# Patient Record
Sex: Female | Born: 1965
Health system: Southern US, Community
[De-identification: ages and names within clinical notes are randomized; demographics above are authoritative.]

## PROBLEM LIST (undated history)

## (undated) DIAGNOSIS — I10 Essential (primary) hypertension: Secondary | ICD-10-CM

## (undated) DIAGNOSIS — L509 Urticaria, unspecified: Secondary | ICD-10-CM

## (undated) DIAGNOSIS — U071 COVID-19: Secondary | ICD-10-CM

## (undated) DIAGNOSIS — T783XXA Angioneurotic edema, initial encounter: Secondary | ICD-10-CM

## (undated) DIAGNOSIS — Z923 Personal history of irradiation: Secondary | ICD-10-CM

## (undated) DIAGNOSIS — C50919 Malignant neoplasm of unspecified site of unspecified female breast: Secondary | ICD-10-CM

## (undated) DIAGNOSIS — L309 Dermatitis, unspecified: Secondary | ICD-10-CM

## (undated) HISTORY — DX: Essential (primary) hypertension: I10

## (undated) HISTORY — DX: Dermatitis, unspecified: L30.9

## (undated) HISTORY — DX: COVID-19: U07.1

## (undated) HISTORY — DX: Urticaria, unspecified: L50.9

## (undated) HISTORY — DX: Angioneurotic edema, initial encounter: T78.3XXA

---

## 2014-03-22 DIAGNOSIS — C50919 Malignant neoplasm of unspecified site of unspecified female breast: Secondary | ICD-10-CM

## 2014-03-22 DIAGNOSIS — Z923 Personal history of irradiation: Secondary | ICD-10-CM

## 2014-03-22 HISTORY — DX: Malignant neoplasm of unspecified site of unspecified female breast: C50.919

## 2014-03-22 HISTORY — DX: Personal history of irradiation: Z92.3

## 2014-03-22 HISTORY — PX: BREAST LUMPECTOMY: SHX2

## 2015-01-01 ENCOUNTER — Other Ambulatory Visit: Payer: Self-pay | Admitting: Unknown Physician Specialty

## 2015-01-01 DIAGNOSIS — R921 Mammographic calcification found on diagnostic imaging of breast: Secondary | ICD-10-CM

## 2015-01-13 ENCOUNTER — Ambulatory Visit
Admission: RE | Admit: 2015-01-13 | Discharge: 2015-01-13 | Disposition: A | Discharge status: Home / Self Care | Payer: 59 | Source: Ambulatory Visit | Attending: Unknown Physician Specialty | Admitting: Unknown Physician Specialty

## 2015-01-13 DIAGNOSIS — R921 Mammographic calcification found on diagnostic imaging of breast: Secondary | ICD-10-CM

## 2015-11-19 ENCOUNTER — Ambulatory Visit (HOSPITAL_COMMUNITY): Payer: Self-pay | Admitting: Hematology & Oncology

## 2015-12-05 NOTE — Progress Notes (Signed)
[place]  CONSULT NOTE  No care team member to display  CHIEF COMPLAINTS/PURPOSE OF CONSULTATION:  High grade DCIS of the Left breast, 0.4 cm in greatest dimension Lumpectomy Adjuvant XRT Tamoxifen March 2017   HISTORY OF PRESENTING ILLNESS:  Kristen Kaiser 50 y.o. female is here to establish ongoing care for DCIS of the L breast. Routine screening mammogram on 01/15/2015 noted calcifications within the lateral aspect of the L breast. This area measured 8x5x5 mm in extent. Biopsy of this area showed DCIS with necrosis. ER + 100%, PR+ 100%  Kristen Kaiser is unaccompanied.   Breast conservation surgery with Dr. Anthony Sar on January 27, 2015. Margins were clear with the closest being the anterior margin at 0.1cm.  Radiation oncology with Dr. Sondra Come.   She was placed on Tamoxifen by Dr. Tressie Stalker.  She has only had one menstrual cycle since beginning Tamoxifen and it was heavy. She believes she may be going through menopause.  She was on OCP's up to her diagnosis. These have been discontinued.   She sees Dr. Derenda Mis in [place] for gynecology. She is scheduled to see him in November.   She receives annual screening mammograms. She does not perform self breast exams. She had a colonoscopy with Dr. Anthony Sar.  She reports hot flashes. 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   She has no other concerns or complaints.   The patient is here to establish care of high grade DCIS in the Left breast.    MEDICAL HISTORY:  History reviewed. No pertinent past medical history.  SURGICAL HISTORY: History reviewed. No pertinent surgical history.  SOCIAL HISTORY: Social History   Social History  . Marital status: Unknown    Spouse name: N/A  . Number of children: N/A  . Years of education: N/A   Occupational History  . Not on file.   Social History Main Topics  . Smoking status: Unknown If Ever Smoked  . Smokeless tobacco: Not on file  . Alcohol use Not on file    . Drug use: Unknown  . Sexual activity: Not on file   Other Topics Concern  . Not on file   Social History Narrative  . No narrative on file   Married 2 children, ages 50 and 75 0 grandchildren Ex smoker, quit 17 years ago. 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 She enjoys doing crossword puzzles and riding with her husband on his motorcycle  FAMILY HISTORY: History reviewed. No pertinent family history.  Mother is still living at 27 yo Father is still living at 70 yo Grandmother had breast cancer is her 8s. No other family history of breast cancer 1 brother and 1 sister. Healthy  ALLERGIES:  has no allergies on file.  MEDICATIONS:  Current Outpatient Prescriptions  Medication Sig Dispense Refill  . FLUoxetine (PROZAC) 40 MG capsule Take by mouth.    . Metoprolol-Hydrochlorothiazide 25-12.5 MG TB24 Take by mouth.    . tamoxifen (NOLVADEX) 20 MG tablet To start one a day May 21, 2015    . RESTASIS MULTIDOSE 0.05 % ophthalmic emulsion INSTIL 1 DROP TWICE A DAY INTO BOTH EYES  3   No current facility-administered medications for this visit.     Review of Systems  Constitutional: Negative.        Hot flashes  HENT: Negative.   Eyes: Negative.   Respiratory: Negative.   Cardiovascular: Negative.  Gastrointestinal: Negative.   Genitourinary: Negative.   Musculoskeletal: Negative.   Skin: Negative.   Neurological: Negative.   Endo/Heme/Allergies: Negative.   Psychiatric/Behavioral: Positive for depression.       Depression managed with Prozac  All other systems reviewed and are negative. 14 point ROS was done and is otherwise as detailed above or in HPI   PHYSICAL EXAMINATION: ECOG PERFORMANCE STATUS: 0 - Asymptomatic   Vitals:   12/08/15 1609  BP: (!) 148/101  Pulse: 82  Resp: 16  Temp: 98.4 F (36.9 C)   Filed Weights   12/08/15 1609  Weight: 181 lb (82.1 kg)    Physical Exam   Constitutional: She is oriented to person, place, and time and well-developed, well-nourished, and in no distress.  HENT:  Head: Normocephalic and atraumatic.  Nose: Nose normal.  Mouth/Throat: Oropharynx is clear and moist. No oropharyngeal exudate.  Eyes: Conjunctivae and EOM are normal. Pupils are equal, round, and reactive to light. Right eye exhibits no discharge. Left eye exhibits no discharge. No scleral icterus.  Neck: Normal range of motion. Neck supple. No tracheal deviation present. No thyromegaly present.  Cardiovascular: Normal rate, regular rhythm and normal heart sounds.  Exam reveals no gallop and no friction rub.   No murmur heard. Pulmonary/Chest: Effort normal and breath sounds normal. She has no wheezes. She has no rales.  Abdominal: Soft. Bowel sounds are normal. She exhibits no distension and no mass. There is no tenderness. There is no rebound and no guarding.  Musculoskeletal: Normal range of motion. She exhibits no edema.  Lymphadenopathy:    She has no cervical adenopathy.  Neurological: She is alert and oriented to person, place, and time. She has normal reflexes. No cranial nerve deficit. Gait normal. Coordination normal.  Skin: Skin is warm and dry. No rash noted.  Psychiatric: Mood, memory, affect and judgment normal.  Nursing note and vitals reviewed.   LABORATORY DATA:  I have reviewed the data as listed No results found for: WBC, HGB, HCT, MCV, PLT CMP  No results found for: NA, K, CL, CO2, GLUCOSE, BUN, CREATININE, CALCIUM, PROT, ALBUMIN, AST, ALT, ALKPHOS, BILITOT, GFRNONAA, GFRAA   RADIOGRAPHIC STUDIES: I have personally reviewed the radiological images as listed and agreed with the findings in the report. Addendum   ADDENDUM REPORT: 01/15/2015 11:22 ADDENDUM: Pathology revealed GRADE II TO III DUCTAL CARCINOMA IN SITU WITH NECROSIS AND CALCIFICATIONS in the left breast. This was found to be concordant by Dr. Evangeline Dakin. Pathology was  discussed with the patient by her physician, Dr. Santo Held. He made an appointment for her to see Dr. Anthony Sar in [place]. I called her to follow up and to assess her biopsy site. She reported that she had done well and had tenderness at the site. Post biopsy instructions and care were reviewed and her questions were answered. She was encouraged to call [facility] for any additional concerns and questions. Pathology results reported by Susa Raring RN, BSN on January 15, 2015. Electronically Signed   By: Evangeline Dakin M.D.   On: 01/15/2015 11:22  Addended by Evangeline Dakin, MD on 01/15/2015 11:29 AM    Study Result   CLINICAL DATA:  50 year old with screening detected 8 mm group of indeterminate calcifications in the upper outer quadrant of the left breast, posterior depth.  EXAM: LEFT BREAST STEREOTACTIC CORE NEEDLE BIOPSY  COMPARISON:  Previous exams.  FINDINGS: The patient and I discussed the procedure of stereotactic-guided biopsy including benefits and  alternatives. We discussed the high likelihood of a successful procedure. We discussed the risks of the procedure including infection, bleeding, tissue injury, clip migration, and inadequate sampling. Informed written consent was given. The usual time out protocol was performed immediately prior to the procedure.  Using sterile technique and 1% lidocaine as local anesthetic, under stereotactic guidance, a 9 gauge vacuum assisted device was used to perform core needle biopsy of calcifications in the upper outer quadrant of the left breast using a superior approach. Specimen radiograph was performed showing calcifications in essentially all of the core samples. Specimens with calcifications are identified for pathology.  At the conclusion of the procedure, an X shaped tissue marker clip was deployed into the biopsy cavity. Follow-up 2-view mammogram was performed and dictated  separately.  IMPRESSION: Stereotactic-guided biopsy of an 8 mm group of indeterminate calcifications in the upper outer quadrant of the left breast, posterior depth. No apparent complications.  Electronically Signed: By: Evangeline Dakin M.D. On: 01/13/2015 10:38      PATHOLOGY   ASSESSMENT & PLAN:   High grade DCIS of the Left breast Lumpectomy Adjuvant XRT Tamoxifen March 2017 Hx Depression  The patient is here to establish care of high grade DCIS in the Left breast. Taking Tamoxifen since March 2017.  I spoke with the patient about SSRI use with Tamoxifen. I reviewed the drug interactions between SSRIs and Tamoxifen. We specifically discussed the following:   Selective serotonin reuptake inhibitors (SSRI): Concomitant use with select SSRIs may result in decreased tamoxifen efficacy. Strong CYP2D6 inhibitors (eg, fluoxetine, paroxetine) and moderate CYP2D6 inhibitors (eg, sertraline) are reported to interfere with transformation to the active metabolite endoxifen; when possible, select alternative medications with minimal or no impact on endoxifen levels (NCCN Breast Cancer Risk Reduction Guidelines v.1.2013; Sideras, 2010). Weak CYP2D6 inhibitors (eg, venlafaxine, citalopram) have minimal effect on the conversion to endoxifen (Jin, 2005; NCCN Breast Cancer Risk Reduction Guidelines v.1.2013); escitalopram is also a weak CYP2D6 inhibitor. In a retrospective analysis of breast cancer patients taking tamoxifen and SSRIs, concomitant use of paroxetine and tamoxifen was associated with an increased risk of death due to breast cancer (Kelly, 2010).  We are going to taper her off prozac and onto celexa. She will let me know if she has any problems with this. She was given written instructions on how to do this.  NCCN guidelines recommends the following surveillance for DCIS of breast:  1. Interval history and physical exam every 6-12 months for 5 years, then annually.  2. Mammogram  every 12 months (and 6-12 months postradiation therapy if breast conserved [Category 2B]).  3. If treated with Tamoxifen, monitor per NCCN guidelines for Breast Cancer Risk Reduction. She will need yearly exams with her gynecologist.  RTC 3 months. Mammogram due this fall.    From Dr. Tressie Stalker:   All questions were answered. The patient knows to call the clinic with any problems, questions or concerns.  This document serves as a record of services personally performed by Ancil Linsey, MD. It was created on her behalf by Arlyce Harman, a trained medical scribe. The creation of this record is based on the scribe's personal observations and the provider's statements to them. This document has been checked and approved by the attending provider.  I have reviewed the above documentation for accuracy and completeness and I agree with the above.  This note was electronically signed.    Molli Hazard, MD  12/08/2015 4:11 PM

## 2015-12-08 ENCOUNTER — Encounter (HOSPITAL_COMMUNITY): Payer: Self-pay | Admitting: Hematology & Oncology

## 2015-12-08 ENCOUNTER — Encounter (HOSPITAL_COMMUNITY): Payer: Self-pay | Attending: Hematology & Oncology | Admitting: Hematology & Oncology

## 2015-12-08 VITALS — BP 148/101 | HR 82 | Temp 98.4°F | Resp 16 | Ht 62.25 in | Wt 181.0 lb

## 2015-12-08 DIAGNOSIS — D0512 Intraductal carcinoma in situ of left breast: Secondary | ICD-10-CM

## 2015-12-08 DIAGNOSIS — Z170 Estrogen receptor positive status [ER+]: Secondary | ICD-10-CM

## 2015-12-08 DIAGNOSIS — Z5181 Encounter for therapeutic drug level monitoring: Secondary | ICD-10-CM

## 2015-12-08 DIAGNOSIS — F32A Depression, unspecified: Secondary | ICD-10-CM

## 2015-12-08 DIAGNOSIS — F329 Major depressive disorder, single episode, unspecified: Secondary | ICD-10-CM

## 2015-12-08 DIAGNOSIS — Z79810 Long term (current) use of selective estrogen receptor modulators (SERMs): Secondary | ICD-10-CM

## 2015-12-08 MED ORDER — FLUOXETINE HCL 20 MG PO CAPS: 20.0000 mg | ORAL_CAPSULE | Freq: Every day | ORAL | 0 refills | Status: DC

## 2015-12-08 NOTE — Patient Instructions (Signed)
[place] at [facility] Discharge Instructions  RECOMMENDATIONS MADE BY THE CONSULTANT AND ANY TEST RESULTS WILL BE SENT TO YOUR REFERRING PHYSICIAN.  You were seen today by Dr.Penland. A Rx for Prozac 20mg  has been sent to your pharmacy.  As you get close to finishing the Rx call and we will start you on Celexa. Return in 3 months.   Thank you for choosing [place] at [facility] to provide your oncology and hematology care.  To afford each patient quality time with our provider, please arrive at least 15 minutes before your scheduled appointment time.   Beginning January 23rd 2017 lab work for the [facility] will be done in the  Main lab at [facility] on 1st floor. If you have a lab appointment with the [facility] please come in thru the  Main Entrance and check in at the main information desk  You need to re-schedule your appointment should you arrive 10 or more minutes late.  We strive to give you quality time with our providers, and arriving late affects you and other patients whose appointments are after yours.  Also, if you no show three or more times for appointments you may be dismissed from the clinic at the providers discretion.     Again, thank you for choosing [facility].  Our hope is that these requests will decrease the amount of time that you wait before being seen by our physicians.       _____________________________________________________________  Should you have questions after your visit to [facility], please contact our office at [phone] between the hours of 8:30 a.m. and 4:30 p.m.  Voicemails left after 4:30 p.m. will not be returned until the following business day.  For prescription refill requests, have your pharmacy contact our office.         Resources For Cancer Patients and their Caregivers ? American Cancer Society: Can assist with transportation, wigs, general  needs, runs Look Good Feel Better.        (409)431-5665 ? Cancer Care: Provides financial assistance, online support groups, medication/co-pay assistance.  1-800-813-HOPE 563 571 1937) ? [facility] Assists [place] cancer patients and their families through emotional , educational and financial support.  979-800-6106 ? [place] DSS Where to apply for food stamps, Medicaid and utility assistance. 902-178-6953 ? [employer]: Transportation to medical appointments. 5635270976 ? Social Security Administration: May apply for disability if have a Stage IV cancer. 929-548-1520 330-212-5503 ? [employer], Disability and Transit Services: Assists with nutrition, care and transit needs. [place] Support Programs: @10RELATIVEDAYS @ > Cancer Support Group  2nd Tuesday of the month 1pm-2pm, Journey Room  > Creative Journey  3rd Tuesday of the month 1130am-1pm, Journey Room  > Look Good Feel Better  1st Wednesday of the month 10am-12 noon, Journey Room (Call [facility] to register [phone])

## 2015-12-18 ENCOUNTER — Encounter (HOSPITAL_COMMUNITY): Payer: Self-pay | Admitting: Hematology & Oncology

## 2015-12-18 DIAGNOSIS — D0512 Intraductal carcinoma in situ of left breast: Secondary | ICD-10-CM | POA: Insufficient documentation

## 2016-01-08 ENCOUNTER — Other Ambulatory Visit (HOSPITAL_COMMUNITY): Payer: Self-pay | Admitting: Oncology

## 2016-01-08 DIAGNOSIS — D0512 Intraductal carcinoma in situ of left breast: Secondary | ICD-10-CM

## 2016-01-12 ENCOUNTER — Other Ambulatory Visit (HOSPITAL_COMMUNITY): Payer: Self-pay | Admitting: Oncology

## 2016-01-12 DIAGNOSIS — D0512 Intraductal carcinoma in situ of left breast: Secondary | ICD-10-CM

## 2016-01-19 ENCOUNTER — Other Ambulatory Visit (HOSPITAL_COMMUNITY): Payer: Self-pay | Admitting: Oncology

## 2016-01-19 ENCOUNTER — Telehealth (HOSPITAL_COMMUNITY): Payer: Self-pay | Admitting: *Deleted

## 2016-01-19 MED ORDER — CITALOPRAM HYDROBROMIDE 20 MG PO TABS: 20.0000 mg | ORAL_TABLET | Freq: Every day | ORAL | 0 refills | Status: DC

## 2016-01-23 ENCOUNTER — Other Ambulatory Visit (HOSPITAL_COMMUNITY): Payer: Self-pay

## 2016-01-27 ENCOUNTER — Encounter (HOSPITAL_COMMUNITY): Payer: Self-pay

## 2016-02-02 ENCOUNTER — Other Ambulatory Visit (HOSPITAL_COMMUNITY): Payer: Self-pay | Admitting: Oncology

## 2016-02-02 DIAGNOSIS — Z9889 Other specified postprocedural states: Secondary | ICD-10-CM

## 2016-02-03 ENCOUNTER — Ambulatory Visit (HOSPITAL_COMMUNITY)
Admission: RE | Admit: 2016-02-03 | Discharge: 2016-02-03 | Disposition: A | Discharge status: Home / Self Care | Payer: BLUE CROSS/BLUE SHIELD | Source: Ambulatory Visit | Attending: Oncology | Admitting: Oncology

## 2016-02-03 ENCOUNTER — Encounter (HOSPITAL_COMMUNITY): Payer: Self-pay | Admitting: Radiology

## 2016-02-03 DIAGNOSIS — Z853 Personal history of malignant neoplasm of breast: Secondary | ICD-10-CM | POA: Insufficient documentation

## 2016-02-03 DIAGNOSIS — D0512 Intraductal carcinoma in situ of left breast: Secondary | ICD-10-CM

## 2016-02-17 ENCOUNTER — Other Ambulatory Visit (HOSPITAL_COMMUNITY): Payer: Self-pay | Admitting: Oncology

## 2016-03-08 ENCOUNTER — Ambulatory Visit (HOSPITAL_COMMUNITY): Payer: Self-pay | Admitting: Hematology & Oncology

## 2016-03-08 NOTE — Progress Notes (Signed)
This encounter was created in error - please disregard.

## 2016-04-12 ENCOUNTER — Encounter (HOSPITAL_COMMUNITY): Payer: Self-pay | Admitting: Hematology & Oncology

## 2016-04-12 ENCOUNTER — Encounter (HOSPITAL_COMMUNITY): Payer: BLUE CROSS/BLUE SHIELD | Attending: Hematology & Oncology | Admitting: Hematology & Oncology

## 2016-04-12 VITALS — BP 137/89 | HR 68 | Temp 98.2°F | Resp 16 | Wt 182.0 lb

## 2016-04-12 DIAGNOSIS — Z5181 Encounter for therapeutic drug level monitoring: Secondary | ICD-10-CM

## 2016-04-12 DIAGNOSIS — F329 Major depressive disorder, single episode, unspecified: Secondary | ICD-10-CM

## 2016-04-12 DIAGNOSIS — Z79810 Long term (current) use of selective estrogen receptor modulators (SERMs): Secondary | ICD-10-CM

## 2016-04-12 DIAGNOSIS — D0512 Intraductal carcinoma in situ of left breast: Secondary | ICD-10-CM

## 2016-04-12 NOTE — Patient Instructions (Signed)
[facility] at [facility] Discharge Instructions  RECOMMENDATIONS MADE BY THE CONSULTANT AND ANY TEST RESULTS WILL BE SENT TO YOUR REFERRING PHYSICIAN.  You were seen today by Dr. Penland Follow up in 3 months with labwork  Thank you for choosing [facility] at [facility] to provide your oncology and hematology care.  To afford each patient quality time with our provider, please arrive at least 15 minutes before your scheduled appointment time.    If you have a lab appointment with the Cancer Center please come in thru the  Main Entrance and check in at the main information desk  You need to re-schedule your appointment should you arrive 10 or more minutes late.  We strive to give you quality time with our providers, and arriving late affects you and other patients whose appointments are after yours.  Also, if you no show three or more times for appointments you may be dismissed from the clinic at the providers discretion.     Again, thank you for choosing [facility].  Our hope is that these requests will decrease the amount of time that you wait before being seen by our physicians.       _____________________________________________________________  Should you have questions after your visit to [facility], please contact our office at [phone] between the hours of 8:30 a.m. and 4:30 p.m.  Voicemails left after 4:30 p.m. will not be returned until the following business day.  For prescription refill requests, have your pharmacy contact our office.       Resources For Cancer Patients and their Caregivers ? American Cancer Society: Can assist with transportation, wigs, general needs, runs Look Good Feel Better.        1-888-227-6333 ? Cancer Care: Provides financial assistance, online support groups, medication/co-pay assistance.  1-800-813-HOPE (4673) ? [facility] Assists [place] cancer patients and their families through emotional , educational and financial support.  336-427-4357 ? [place] DSS Where to apply for food stamps, Medicaid and utility assistance. 336-342-1394 ? RCATS: Transportation to medical appointments. 336-347-2287 ? Social Security Administration: May apply for disability if have a Stage IV cancer. 336-342-7796 1-800-772-1213 ? [place] Aging, Disability and Transit Services: Assists with nutrition, care and transit needs. 336-349-2343  Cancer Center Support Programs: @10RELATIVEDAYS@ > Cancer Support Group  2nd Tuesday of the month 1pm-2pm, Journey Room  > Creative Journey  3rd Tuesday of the month 1130am-1pm, Journey Room  > Look Good Feel Better  1st Wednesday of the month 10am-12 noon, Journey Room (Call American Cancer Society to register 1-800-395-5775)    

## 2016-04-12 NOTE — Progress Notes (Signed)
[place]  PROGRESS NOTE  Patient Care Team: Caryl Bis, MD as PCP - General (Family Medicine)  CHIEF COMPLAINTS/PURPOSE OF CONSULTATION:  High grade DCIS of the Left breast, 0.4 cm in greatest dimension Lumpectomy Adjuvant XRT Tamoxifen March 2017   HISTORY OF PRESENTING ILLNESS:  Kristen Kaiser 51 y.o. female is here to establish ongoing care for DCIS of the L breast. Routine screening mammogram on 01/15/2015 noted calcifications within the lateral aspect of the L breast. This area measured 8x5x5 mm in extent. Biopsy of this area showed DCIS with necrosis. ER + 100%, PR+ 100%  Breast conservation surgery with Dr. Anthony Sar on January 27, 2015. Margins were clear with the closest being the anterior margin at 0.1cm.  Radiation oncology with Dr. Sondra Come.   Kristen Kaiser was placed on Tamoxifen by Dr. Tressie Stalker. Kristen Kaiser has only had one menstrual cycle since beginning Tamoxifen and it was heavy. Kristen Kaiser believes Kristen Kaiser may be going through menopause.  Kristen Kaiser was on OCP's up to her diagnosis. These have been discontinued.   Kristen Kaiser is unaccompanied. Kristen Kaiser is doing well.   Kristen Kaiser is doing well on the Celexa.  Kristen Kaiser just had her mammogram in November.   Kristen Kaiser has some hot flashes. Kristen Kaiser occasionally wakes up at night with one, but it most commonly happens when Kristen Kaiser's at work and using a blow dryer. It gets better when Kristen Kaiser stands in front of a fan.   Kristen Kaiser has had a good appetite. Kristen Kaiser has had some swelling in her fingers and feet. Denies any other complaints.   MEDICAL HISTORY:  History reviewed. No pertinent past medical history. DCIS L breast Anxiety/Depression  SURGICAL HISTORY: History reviewed. No pertinent surgical history. L lumpectomy with Dr. Anthony Sar   SOCIAL HISTORY: Social History   Social History  . Marital status: Unknown    Spouse name: N/A  . Number of children: N/A  . Years of education: N/A   Occupational History  . Not on file.   Social History Main Topics  . Smoking  status: Former Smoker    Packs/day: 1.00    Years: 15.00    Types: Cigarettes    Quit date: 12/18/1998  . Smokeless tobacco: Never Used  . Alcohol use Not on file  . Drug use: Unknown  . Sexual activity: Not on file   Other Topics Concern  . Not on file   Social History Narrative  . No narrative on file   Married 2 children, ages 37 and 54 0 grandchildren Ex smoker, quit 17 years ago. 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 Kristen Kaiser enjoys doing crossword puzzles and riding with her husband on his motorcycle  FAMILY HISTORY: History reviewed. No pertinent family history.  Mother is still living at 68 yo Father is still living at 55 yo Grandmother had breast cancer is her 72s. No other family history of breast cancer 1 brother and 1 sister. Healthy  ALLERGIES:  has no allergies on file.  MEDICATIONS:  Current Outpatient Prescriptions  Medication Sig Dispense Refill  . citalopram (CELEXA) 20 MG tablet TAKE 1 TABLET (20 MG TOTAL) BY MOUTH DAILY. 30 tablet 2  . metoprolol-hydrochlorothiazide (LOPRESSOR HCT) 50-25 MG tablet   11  . RESTASIS MULTIDOSE 0.05 % ophthalmic emulsion INSTIL 1 DROP TWICE A DAY INTO BOTH EYES  3  . tamoxifen (NOLVADEX) 20 MG tablet To start one a day May 21, 2015  No current facility-administered medications for this visit.     Review of Systems  Constitutional:       Hot flashes. No periods since March 2017. Good appetite.   HENT: Negative.   Eyes: Negative.   Respiratory: Negative.   Cardiovascular: Negative.   Gastrointestinal: Negative.   Genitourinary: Negative.   Musculoskeletal:       Swelling in hands and feet  Skin: Negative.   Neurological: Negative.   Endo/Heme/Allergies: Negative.   Psychiatric/Behavioral: Negative.   All other systems reviewed and are negative. 14 point ROS was done and is otherwise as detailed above or in HPI  PHYSICAL EXAMINATION: ECOG  PERFORMANCE STATUS: 0 - Asymptomatic  Vitals:   04/12/16 1407  BP: 137/89  Pulse: 68  Resp: 16  Temp: 98.2 F (36.8 C)   Filed Weights   04/12/16 1407  Weight: 182 lb (82.6 kg)   Physical Exam  Constitutional: Kristen Kaiser is oriented to person, place, and time and well-developed, well-nourished, and in no distress.  Pt was able to get on exam table without assistance.   HENT:  Head: Normocephalic and atraumatic.  Nose: Nose normal.  Mouth/Throat: Oropharynx is clear and moist. No oropharyngeal exudate.  Eyes: Conjunctivae and EOM are normal. Pupils are equal, round, and reactive to light. Right eye exhibits no discharge. Left eye exhibits no discharge. No scleral icterus.  Neck: Normal range of motion. Neck supple. No tracheal deviation present. No thyromegaly present.  Cardiovascular: Normal rate, regular rhythm and normal heart sounds.  Exam reveals no gallop and no friction rub.   No murmur heard. Pulmonary/Chest: Effort normal and breath sounds normal. Kristen Kaiser has no wheezes. Kristen Kaiser has no rales.  Abdominal: Soft. Bowel sounds are normal. Kristen Kaiser exhibits no distension and no mass. There is no tenderness. There is no rebound and no guarding.  Musculoskeletal: Normal range of motion. Kristen Kaiser exhibits no edema.  Lymphadenopathy:    Kristen Kaiser has no cervical adenopathy.  Neurological: Kristen Kaiser is alert and oriented to person, place, and time. Kristen Kaiser has normal reflexes. No cranial nerve deficit. Gait normal. Coordination normal.  Skin: Skin is warm and dry. No rash noted.  Psychiatric: Mood, memory, affect and judgment normal.  Nursing note and vitals reviewed.   LABORATORY DATA:  I have reviewed the data as listed No results found for: WBC, HGB, HCT, MCV, PLT CMP  No results found for: NA, K, CL, CO2, GLUCOSE, BUN, CREATININE, CALCIUM, PROT, ALBUMIN, AST, ALT, ALKPHOS, BILITOT, GFRNONAA, GFRAA   RADIOGRAPHIC STUDIES: I have personally reviewed the radiological images as listed and agreed with the findings  in the report. Study Result   CLINICAL DATA:  History of malignant lumpectomy of the left breast in 2016.Annual re-evaluation.  EXAM: 2D DIGITAL DIAGNOSTIC BILATERAL MAMMOGRAM WITH CAD AND ADJUNCT TOMO  COMPARISON:  Previous exam(s).  ACR Breast Density Category c: The breast tissue is heterogeneously dense, which may obscure small masses.  FINDINGS: There are mild post lumpectomy scarring changes within the superior left breast. There is no specific evidence for recurrent tumor or developing malignancy within either breast.  Mammographic images were processed with CAD.  IMPRESSION: No findings worrisome for recurrent tumor or developing malignancy.  RECOMMENDATION: Bilateral diagnostic mammography in 1 year.  I have discussed the findings and recommendations with the patient. Results were also provided in writing at the conclusion of the visit. If applicable, a reminder letter will be sent to the patient regarding the next appointment.  BI-RADS CATEGORY  1: Negative.   Electronically Signed  By: Altamese Cabal M.D.   On: 02/03/2016 10:06       PATHOLOGY    ASSESSMENT & PLAN:   High grade DCIS of the Left breast Lumpectomy Adjuvant XRT Tamoxifen March 2017 Hx Depression  Kristen Kaiser is to continue on Tamoxifen. Kristen Kaiser is doing well. We switched her from prozac to Celexa and Kristen Kaiser has done well. Kristen Kaiser will continue with Celexa. Kristen Kaiser is currently up to date with mammography. Kristen Kaiser is not due again until November.   NCCN guidelines recommends the following surveillance for DCIS of breast:  1. Interval history and physical exam every 6-12 months for 5 years, then annually.  2. Mammogram every 12 months (and 6-12 months postradiation therapy if breast conserved [Category 2B]).  3. If treated with Tamoxifen, monitor per NCCN guidelines for Breast Cancer Risk Reduction. Kristen Kaiser will need yearly exams with her gynecologist.  Kristen Kaiser will return for a follow up in 3  months. Will check her hormones at this visit.   Orders Placed This Encounter  Procedures  . CBC with Differential    Standing Status:   Future    Standing Expiration Date:   08/10/2016  . Comprehensive metabolic panel    Standing Status:   Future    Standing Expiration Date:   08/10/2016  . Estradiol    Standing Status:   Future    Standing Expiration Date:   08/10/2016  . Follicle stimulating hormone    Standing Status:   Future    Standing Expiration Date:   08/10/2016    All questions were answered. The patient knows to call the clinic with any problems, questions or concerns.  This document serves as a record of services personally performed by Ancil Linsey, MD. It was created on her behalf by Martinique Casey, a trained medical scribe. The creation of this record is based on the scribe's personal observations and the provider's statements to them. This document has been checked and approved by the attending provider.  I have reviewed the above documentation for accuracy and completeness and I agree with the above.  This note was electronically signed.    Molli Hazard, MD  04/23/2016 8:29 AM

## 2016-04-23 ENCOUNTER — Encounter (HOSPITAL_COMMUNITY): Payer: Self-pay | Admitting: Hematology & Oncology

## 2016-06-25 ENCOUNTER — Other Ambulatory Visit (HOSPITAL_COMMUNITY): Payer: Self-pay

## 2016-06-25 DIAGNOSIS — D0512 Intraductal carcinoma in situ of left breast: Secondary | ICD-10-CM

## 2016-06-25 MED ORDER — CITALOPRAM HYDROBROMIDE 20 MG PO TABS: 20.0000 mg | ORAL_TABLET | Freq: Every day | ORAL | 1 refills | Status: DC

## 2016-06-25 NOTE — Telephone Encounter (Signed)
Received refill request from patients pharmacy for 90 day supply of citalopram. Reviewed with NP, chart checked and refilled.

## 2016-06-28 ENCOUNTER — Telehealth (HOSPITAL_COMMUNITY): Payer: Self-pay | Admitting: *Deleted

## 2016-06-28 ENCOUNTER — Other Ambulatory Visit (HOSPITAL_COMMUNITY): Payer: Self-pay | Admitting: Emergency Medicine

## 2016-06-28 MED ORDER — TAMOXIFEN CITRATE 20 MG PO TABS: 20.0000 mg | ORAL_TABLET | Freq: Every day | ORAL | 6 refills | Status: DC

## 2016-07-12 ENCOUNTER — Ambulatory Visit (HOSPITAL_COMMUNITY): Payer: Self-pay | Admitting: Hematology & Oncology

## 2016-07-12 ENCOUNTER — Ambulatory Visit (HOSPITAL_COMMUNITY): Payer: Self-pay

## 2016-07-12 ENCOUNTER — Other Ambulatory Visit (HOSPITAL_COMMUNITY): Payer: Self-pay

## 2016-07-26 ENCOUNTER — Encounter (HOSPITAL_COMMUNITY): Payer: Self-pay | Admitting: Oncology

## 2016-07-26 ENCOUNTER — Encounter (HOSPITAL_COMMUNITY): Payer: BLUE CROSS/BLUE SHIELD | Attending: Oncology

## 2016-07-26 ENCOUNTER — Encounter (HOSPITAL_BASED_OUTPATIENT_CLINIC_OR_DEPARTMENT_OTHER): Payer: BLUE CROSS/BLUE SHIELD | Admitting: Oncology

## 2016-07-26 VITALS — BP 142/93 | HR 72 | Temp 98.0°F | Resp 16 | Wt 184.2 lb

## 2016-07-26 DIAGNOSIS — G629 Polyneuropathy, unspecified: Secondary | ICD-10-CM | POA: Insufficient documentation

## 2016-07-26 DIAGNOSIS — G5603 Carpal tunnel syndrome, bilateral upper limbs: Secondary | ICD-10-CM | POA: Insufficient documentation

## 2016-07-26 DIAGNOSIS — D0512 Intraductal carcinoma in situ of left breast: Secondary | ICD-10-CM | POA: Diagnosis not present

## 2016-07-26 DIAGNOSIS — F329 Major depressive disorder, single episode, unspecified: Secondary | ICD-10-CM | POA: Insufficient documentation

## 2016-07-26 DIAGNOSIS — Z79810 Long term (current) use of selective estrogen receptor modulators (SERMs): Secondary | ICD-10-CM

## 2016-07-26 DIAGNOSIS — Z87891 Personal history of nicotine dependence: Secondary | ICD-10-CM | POA: Diagnosis not present

## 2016-07-26 DIAGNOSIS — G609 Hereditary and idiopathic neuropathy, unspecified: Secondary | ICD-10-CM | POA: Diagnosis not present

## 2016-07-26 DIAGNOSIS — F419 Anxiety disorder, unspecified: Secondary | ICD-10-CM | POA: Diagnosis not present

## 2016-07-26 LAB — COMPREHENSIVE METABOLIC PANEL
ALK PHOS: 86 U/L (ref 38–126)
ALT: 17 U/L (ref 14–54)
AST: 23 U/L (ref 15–41)
Albumin: 3.6 g/dL (ref 3.5–5.0)
Anion gap: 7 (ref 5–15)
BILIRUBIN TOTAL: 0.5 mg/dL (ref 0.3–1.2)
BUN: 11 mg/dL (ref 6–20)
CALCIUM: 9.1 mg/dL (ref 8.9–10.3)
CO2: 24 mmol/L (ref 22–32)
CREATININE: 0.69 mg/dL (ref 0.44–1.00)
Chloride: 106 mmol/L (ref 101–111)
GFR calc Af Amer: >60 mL/min (ref >60)
GFR calc non Af Amer: >60 mL/min (ref >60)
GLUCOSE: 101 mg/dL — AB (ref 65–99)
Potassium: 3.6 mmol/L (ref 3.5–5.1)
Sodium: 137 mmol/L (ref 135–145)
TOTAL PROTEIN: 6.7 g/dL (ref 6.5–8.1)

## 2016-07-26 LAB — CBC WITH DIFFERENTIAL/PLATELET
BASOS ABS: 0 10*3/uL (ref 0.0–0.1)
BASOS PCT: 0 %
EOS ABS: 0.1 10*3/uL (ref 0.0–0.7)
EOS PCT: 1 %
HEMATOCRIT: 41 % (ref 36.0–46.0)
HEMOGLOBIN: 14.1 g/dL (ref 12.0–15.0)
Lymphocytes Relative: 22 %
Lymphs Abs: 1.2 10*3/uL (ref 0.7–4.0)
MCH: 30.9 pg (ref 26.0–34.0)
MCHC: 34.4 g/dL (ref 30.0–36.0)
MCV: 89.9 fL (ref 78.0–100.0)
Monocytes Absolute: 0.3 10*3/uL (ref 0.1–1.0)
Monocytes Relative: 6 %
NEUTROS PCT: 71 %
Neutro Abs: 3.9 10*3/uL (ref 1.7–7.7)
Platelets: 200 10*3/uL (ref 150–400)
RBC: 4.56 MIL/uL (ref 3.87–5.11)
RDW: 13.1 % (ref 11.5–15.5)
WBC: 5.5 10*3/uL (ref 4.0–10.5)

## 2016-07-26 MED ORDER — GABAPENTIN 300 MG PO CAPS: 300.0000 mg | ORAL_CAPSULE | Freq: Two times a day (BID) | ORAL | 4 refills | Status: DC

## 2016-07-26 NOTE — Progress Notes (Signed)
[place]  PROGRESS NOTE  Patient Care Team: Caryl Bis, MD as PCP - General (Family Medicine)  CHIEF COMPLAINTS/PURPOSE OF CONSULTATION:  High grade DCIS of the Left breast, 0.4 cm in greatest dimension Lumpectomy Adjuvant XRT Tamoxifen March 2017   HISTORY OF PRESENTING ILLNESS:  Kristen Kaiser 51 y.o. female is here to establish ongoing care for DCIS of the L breast. Routine screening mammogram on 01/15/2015 noted calcifications within the lateral aspect of the L breast. This area measured 8x5x5 mm in extent. Biopsy of this area showed DCIS with necrosis. ER + 100%, PR+ 100%  Breast conservation surgery with Dr. Anthony Sar on January 27, 2015. Margins were clear with the closest being the anterior margin at 0.1cm.  Radiation oncology with Dr. Sondra Come.   She was placed on Tamoxifen by Dr. Tressie Stalker. She has only had one menstrual cycle since beginning Tamoxifen and it was heavy. She believes she may be going through menopause.  She was on OCP's up to her diagnosis. These have been discontinued.   Kristen Kaiser presents today for continuing follow up. She is doing well. I personally reviewed and went over labs with the patient.  She has some hot flashes. She occasionally wakes up at night with one, but it most commonly happens when she's at work and using a blow dryer. It gets better when she stands in front of a fan. She also reports neuropathy in her fingers bilaterally. She notes she occasionally gets "aches" in her toes as well. She has a history of carpal tunnel syndrome in both her hands, but she feels like this tingling is different. It is on all of her fingertips.  She denies feeling any masses on her breasts on self breast exams.    MEDICAL HISTORY:  No past medical history on file. DCIS L breast Anxiety/Depression  SURGICAL HISTORY: No past surgical history on file. L lumpectomy with Dr. Anthony Sar   SOCIAL HISTORY: Social History   Social History  .  Marital status: Unknown    Spouse name: N/A  . Number of children: N/A  . Years of education: N/A   Occupational History  . Not on file.   Social History Main Topics  . Smoking status: Former Smoker    Packs/day: 1.00    Years: 15.00    Types: Cigarettes    Quit date: 12/18/1998  . Smokeless tobacco: Never Used  . Alcohol use Not on file  . Drug use: Unknown  . Sexual activity: Not on file   Other Topics Concern  . Not on file   Social History Narrative  . No narrative on file   Married 2 children, ages 37 and 23 0 grandchildren Ex smoker, quit 17 years ago. 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 She enjoys doing crossword puzzles and riding with her husband on his motorcycle  FAMILY HISTORY: No family history on file.  Mother is still living at 38 yo Father is still living at 83 yo Grandmother had breast cancer is her 31s. No other family history of breast cancer 1 brother and 1 sister. Healthy  ALLERGIES:  has no allergies on file.  MEDICATIONS:  Current Outpatient Prescriptions  Medication Sig Dispense Refill  . citalopram (CELEXA) 20 MG tablet Take 1 tablet (20 mg total) by mouth daily. 90 tablet 1  . metoprolol-hydrochlorothiazide (LOPRESSOR HCT) 50-25 MG tablet   11  . RESTASIS MULTIDOSE 0.05 %  ophthalmic emulsion INSTIL 1 DROP TWICE A DAY INTO BOTH EYES  3  . tamoxifen (NOLVADEX) 20 MG tablet Take 1 tablet (20 mg total) by mouth daily. 30 tablet 6   No current facility-administered medications for this visit.     Review of Systems  Constitutional:       Hot flashes. No periods since March 2017 when she started her tamoxifen. Good appetite.   HENT: Negative.   Eyes: Negative.   Respiratory: Negative.   Cardiovascular: Negative.   Gastrointestinal: Negative.   Genitourinary: Negative.   Skin: Negative.   Neurological: Positive for tingling (neuropathy in fingertips).    Endo/Heme/Allergies: Negative.   Psychiatric/Behavioral: Negative.   All other systems reviewed and are negative. 14 point ROS was done and is otherwise as detailed above or in HPI  PHYSICAL EXAMINATION: ECOG PERFORMANCE STATUS: 0 - Asymptomatic  Vitals:   07/26/16 1125  BP: (!) 142/93  Pulse: 72  Resp: 16  Temp: 98 F (36.7 C)     Physical Exam  Constitutional: She is oriented to person, place, and time and well-developed, well-nourished, and in no distress.  Pt was able to get on exam table without assistance.   HENT:  Head: Normocephalic and atraumatic.  Nose: Nose normal.  Mouth/Throat: Oropharynx is clear and moist. No oropharyngeal exudate.  Eyes: Conjunctivae and EOM are normal. Pupils are equal, round, and reactive to light. Right eye exhibits no discharge. Left eye exhibits no discharge. No scleral icterus.  Neck: Normal range of motion. Neck supple. No tracheal deviation present. No thyromegaly present.  Cardiovascular: Normal rate, regular rhythm and normal heart sounds.  Exam reveals no gallop and no friction rub.   No murmur heard. Pulmonary/Chest: Effort normal and breath sounds normal. She has no wheezes. She has no rales. Right breast exhibits no inverted nipple, no mass, no nipple discharge, no skin change and no tenderness. Left breast exhibits no inverted nipple, no mass, no nipple discharge, no skin change and no tenderness. Breasts are symmetrical.  Abdominal: Soft. Bowel sounds are normal. She exhibits no distension and no mass. There is no tenderness. There is no rebound and no guarding.  Musculoskeletal: Normal range of motion. She exhibits no edema.  Lymphadenopathy:    She has no cervical adenopathy.  Neurological: She is alert and oriented to person, place, and time. She has normal reflexes. No cranial nerve deficit. Gait normal. Coordination normal.  Skin: Skin is warm and dry. No rash noted.  Psychiatric: Mood, memory, affect and judgment normal.   Nursing note and vitals reviewed.   LABORATORY DATA:  I have reviewed the data as listed Lab Results  Component Value Date   WBC 5.5 07/26/2016   HGB 14.1 07/26/2016   HCT 41.0 07/26/2016   MCV 89.9 07/26/2016   PLT 200 07/26/2016   CMP     Component Value Date/Time   NA 137 07/26/2016 1031   K 3.6 07/26/2016 1031   CL 106 07/26/2016 1031   CO2 24 07/26/2016 1031   GLUCOSE 101 (H) 07/26/2016 1031   BUN 11 07/26/2016 1031   CREATININE 0.69 07/26/2016 1031   CALCIUM 9.1 07/26/2016 1031   PROT 6.7 07/26/2016 1031   ALBUMIN 3.6 07/26/2016 1031   AST 23 07/26/2016 1031   ALT 17 07/26/2016 1031   ALKPHOS 86 07/26/2016 1031   BILITOT 0.5 07/26/2016 1031   GFRNONAA >60 07/26/2016 1031   GFRAA >60 07/26/2016 1031     RADIOGRAPHIC STUDIES: I have personally reviewed the  radiological images as listed and agreed with the findings in the report. Study Result   CLINICAL DATA:  History of malignant lumpectomy of the left breast in 2016.Annual re-evaluation.  EXAM: 2D DIGITAL DIAGNOSTIC BILATERAL MAMMOGRAM WITH CAD AND ADJUNCT TOMO  COMPARISON:  Previous exam(s).  ACR Breast Density Category c: The breast tissue is heterogeneously dense, which may obscure small masses.  FINDINGS: There are mild post lumpectomy scarring changes within the superior left breast. There is no specific evidence for recurrent tumor or developing malignancy within either breast.  Mammographic images were processed with CAD.  IMPRESSION: No findings worrisome for recurrent tumor or developing malignancy.  RECOMMENDATION: Bilateral diagnostic mammography in 1 year.  I have discussed the findings and recommendations with the patient. Results were also provided in writing at the conclusion of the visit. If applicable, a reminder letter will be sent to the patient regarding the next appointment.  BI-RADS CATEGORY  1: Negative.   Electronically Signed   By: Altamese Cabal  M.D.   On: 02/03/2016 10:06       PATHOLOGY    ASSESSMENT & PLAN:   High grade DCIS of the Left breast Lumpectomy Adjuvant XRT Tamoxifen March 2017 Hx Depression Neuropathy in hands  Labs reviewed with the patient. Results noted above. CBC, CMP WNL. FSH and estradiol are still pending.   Clinically NED on breast exam today.  Continue with tamoxifen.  She needs yearly eye and gynecological exams. Ordered bilateral diagnostic mammogram to be performed in 01/2017. Prescribed her 300 mg BID Gabapentin for her neuropathy in her finger tips. May uptitrate to 300mg  PO TID if minimal improvement.  RTC in 6 months for follow up with labs.  NCCN guidelines recommends the following surveillance for DCIS of breast:  1. Interval history and physical exam every 6-12 months for 5 years, then annually.  2. Mammogram every 12 months (and 6-12 months postradiation therapy if breast conserved [Category 2B]).  3. If treated with Tamoxifen, monitor per NCCN guidelines for Breast Cancer Risk Reduction. Orders Placed This Encounter  Procedures  . MM Digital Diagnostic Bilat    Standing Status:   Future    Standing Expiration Date:   07/26/2017    Order Specific Question:   Reason for Exam (SYMPTOM  OR DIAGNOSIS REQUIRED)    Answer:   DCIS    Order Specific Question:   Is the patient pregnant?    Answer:   No    Order Specific Question:   Preferred imaging location?    Answer:   [facility]  . CBC with Differential    Standing Status:   Future    Standing Expiration Date:   07/26/2017  . Comprehensive metabolic panel    Standing Status:   Future    Standing Expiration Date:   07/26/2017    All questions were answered. The patient knows to call the clinic with any problems, questions or concerns.  This document serves as a record of services personally performed by Twana First, MD. It was created on her behalf by Shirlean Mylar, a trained medical scribe. The creation of this record  is based on the scribe's personal observations and the provider's statements to them. This document has been checked and approved by the attending provider.  I have reviewed the above documentation for accuracy and completeness and I agree with the above.  This note was electronically signed.    Mikey [employer]  07/26/2016 11:28 AM

## 2016-07-26 NOTE — Patient Instructions (Signed)
Kristen Kaiser at [facility] Discharge Instructions  RECOMMENDATIONS MADE BY THE CONSULTANT AND ANY TEST RESULTS WILL BE SENT TO YOUR REFERRING PHYSICIAN.  You were seen today by Dr. Twana First  Follow up in 6 months with lab work Mammogram ordered for November  Thank you for choosing [place] at [facility] to provide your oncology and hematology care.  To afford each patient quality time with our provider, please arrive at least 15 minutes before your scheduled appointment time.    If you have a lab appointment with the [facility] please come in thru the  Main Entrance and check in at the main information desk  You need to re-schedule your appointment should you arrive 10 or more minutes late.  We strive to give you quality time with our providers, and arriving late affects you and other patients whose appointments are after yours.  Also, if you no show three or more times for appointments you may be dismissed from the clinic at the providers discretion.     Again, thank you for choosing [facility].  Our hope is that these requests will decrease the amount of time that you wait before being seen by our physicians.       _____________________________________________________________  Should you have questions after your visit to [facility], please contact our office at [phone] between the hours of 8:30 a.m. and 4:30 p.m.  Voicemails left after 4:30 p.m. will not be returned until the following business day.  For prescription refill requests, have your pharmacy contact our office.       Resources For Cancer Patients and their Caregivers ? American Cancer Society: Can assist with transportation, wigs, general needs, runs Look Good Feel Better.        236-271-1682 ? Cancer Care: Provides financial assistance, online support groups, medication/co-pay assistance.  1-800-813-HOPE (256) 287-6715) ? [place] Assists [place] cancer patients and their families through emotional , educational and financial support.  301-141-5769 ? [place] DSS Where to apply for food stamps, Medicaid and utility assistance. 450-095-7483 ? [facility]: Transportation to medical appointments. 403-050-5251 ? Social Security Administration: May apply for disability if have a Stage IV cancer. 701 622 9077 972-349-6770 ? [employer], Disability and Transit Services: Assists with nutrition, care and transit needs. [place] Support Programs: @10RELATIVEDAYS @ > Cancer Support Group  2nd Tuesday of the month 1pm-2pm, Journey Room  > Creative Journey  3rd Tuesday of the month 1130am-1pm, Journey Room  > Look Good Feel Better  1st Wednesday of the month 10am-12 noon, Journey Room (Call [place] to register [phone])

## 2016-07-27 LAB — ESTRADIOL: Estradiol: 271.7 pg/mL

## 2016-07-27 LAB — FOLLICLE STIMULATING HORMONE: FSH: 32.2 m[IU]/mL

## 2017-01-31 ENCOUNTER — Ambulatory Visit (HOSPITAL_COMMUNITY): Payer: Self-pay

## 2017-01-31 ENCOUNTER — Other Ambulatory Visit (HOSPITAL_COMMUNITY): Payer: Self-pay

## 2017-02-01 ENCOUNTER — Ambulatory Visit (HOSPITAL_COMMUNITY)
Admission: RE | Admit: 2017-02-01 | Discharge: 2017-02-01 | Disposition: A | Discharge status: Home / Self Care | Payer: BLUE CROSS/BLUE SHIELD | Source: Ambulatory Visit | Attending: Oncology | Admitting: Oncology

## 2017-02-01 DIAGNOSIS — D0512 Intraductal carcinoma in situ of left breast: Secondary | ICD-10-CM | POA: Diagnosis present

## 2017-02-04 ENCOUNTER — Other Ambulatory Visit (HOSPITAL_COMMUNITY): Payer: Self-pay | Admitting: Oncology

## 2017-02-04 DIAGNOSIS — D0512 Intraductal carcinoma in situ of left breast: Secondary | ICD-10-CM

## 2017-02-07 ENCOUNTER — Other Ambulatory Visit: Payer: Self-pay

## 2017-02-07 ENCOUNTER — Encounter (HOSPITAL_COMMUNITY): Payer: Self-pay | Admitting: Oncology

## 2017-02-07 ENCOUNTER — Encounter (HOSPITAL_COMMUNITY): Payer: BLUE CROSS/BLUE SHIELD | Attending: Oncology

## 2017-02-07 ENCOUNTER — Other Ambulatory Visit (HOSPITAL_COMMUNITY): Payer: Self-pay | Admitting: Oncology

## 2017-02-07 ENCOUNTER — Encounter (HOSPITAL_BASED_OUTPATIENT_CLINIC_OR_DEPARTMENT_OTHER): Payer: BLUE CROSS/BLUE SHIELD | Admitting: Oncology

## 2017-02-07 VITALS — BP 161/94 | HR 81 | Temp 98.4°F | Resp 16 | Ht 63.0 in | Wt 186.0 lb

## 2017-02-07 DIAGNOSIS — Z170 Estrogen receptor positive status [ER+]: Secondary | ICD-10-CM | POA: Diagnosis not present

## 2017-02-07 DIAGNOSIS — D0512 Intraductal carcinoma in situ of left breast: Secondary | ICD-10-CM | POA: Insufficient documentation

## 2017-02-07 LAB — CBC WITH DIFFERENTIAL/PLATELET
BASOS ABS: 0 10*3/uL (ref 0.0–0.1)
Basophils Relative: 1 %
EOS ABS: 0.1 10*3/uL (ref 0.0–0.7)
Eosinophils Relative: 2 %
HCT: 43.8 % (ref 36.0–46.0)
HEMOGLOBIN: 14.2 g/dL (ref 12.0–15.0)
LYMPHS ABS: 1.2 10*3/uL (ref 0.7–4.0)
Lymphocytes Relative: 21 %
MCH: 29.8 pg (ref 26.0–34.0)
MCHC: 32.4 g/dL (ref 30.0–36.0)
MCV: 92 fL (ref 78.0–100.0)
Monocytes Absolute: 0.5 10*3/uL (ref 0.1–1.0)
Monocytes Relative: 8 %
NEUTROS PCT: 68 %
Neutro Abs: 4 10*3/uL (ref 1.7–7.7)
Platelets: 226 10*3/uL (ref 150–400)
RBC: 4.76 MIL/uL (ref 3.87–5.11)
RDW: 13 % (ref 11.5–15.5)
WBC: 5.9 10*3/uL (ref 4.0–10.5)

## 2017-02-07 LAB — COMPREHENSIVE METABOLIC PANEL
ALK PHOS: 96 U/L (ref 38–126)
ALT: 14 U/L (ref 14–54)
AST: 23 U/L (ref 15–41)
Albumin: 3.6 g/dL (ref 3.5–5.0)
Anion gap: 8 (ref 5–15)
BUN: 9 mg/dL (ref 6–20)
CALCIUM: 9.2 mg/dL (ref 8.9–10.3)
CHLORIDE: 105 mmol/L (ref 101–111)
CO2: 26 mmol/L (ref 22–32)
CREATININE: 0.74 mg/dL (ref 0.44–1.00)
GFR calc non Af Amer: >60 mL/min (ref >60)
GLUCOSE: 104 mg/dL — AB (ref 65–99)
Potassium: 4 mmol/L (ref 3.5–5.1)
SODIUM: 139 mmol/L (ref 135–145)
Total Bilirubin: 0.5 mg/dL (ref 0.3–1.2)
Total Protein: 6.8 g/dL (ref 6.5–8.1)

## 2017-02-07 NOTE — Progress Notes (Signed)
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 female is here to establish ongoing care for DCIS of the L breast. Routine screening mammogram on 01/15/2015 noted calcifications within the lateral aspect of the L breast. This area measured 8x5x5 mm in extent. Biopsy of this area showed DCIS with necrosis. ER + 100%, PR+ 100%  Breast conservation surgery with Dr. Anthony Sar on January 27, 2015. Margins were clear with the closest being the anterior margin at 0.1cm.  Radiation oncology with Dr. Sondra Come.   Patient presents today for continue follow-up.  She states that overall she has been doing well.  She had her annual mammogram on 02/01/2017 which was negative for malignancy.  She continues to take the tamoxifen without any problems.  She continues to have neuropathy in her fingertips, but she has not been taking the gabapentin as prescribed, even though she did note that it was helping her previously.  She states she is worried about getting addicted to gabapentin.   MEDICAL HISTORY:  History reviewed. No pertinent past medical history. DCIS L breast Anxiety/Depression  SURGICAL HISTORY: History reviewed. No pertinent surgical history. L lumpectomy with Dr. Anthony Sar   SOCIAL HISTORY: Social History   Socioeconomic History  . Marital status: Unknown    Spouse name: Not on file  . Number of children: Not on file  . Years of education: Not on file  . Highest education level: Not on file  Social Needs  . Financial resource strain: Not on file  . Food insecurity - worry: Not on file  . Food insecurity - inability: Not on file  . Transportation needs - medical: Not on file  . Transportation needs -  non-medical: Not on file  Occupational History  . Not on file  Tobacco Use  . Smoking status: Former Smoker    Packs/day: 1.00    Years: 15.00    Pack years: 15.00    Types: Cigarettes    Last attempt to quit: 12/18/1998    Years since quitting: 18.1  . Smokeless tobacco: Never Used  Substance and Sexual Activity  . Alcohol use: Not on file  . Drug use: Not on file  . Sexual activity: Not on file  Other Topics Concern  . Not on file  Social History Narrative  . Not on file   Married 2 children, ages 82 and 4 0 grandchildren Ex smoker, quit 17 years ago. 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 She enjoys doing crossword puzzles and riding with her husband on his motorcycle  FAMILY HISTORY: History reviewed. No pertinent family history.  Mother is still living at 66 yo Father is still living at 28 yo Grandmother had breast cancer is her 76s. No other family history of breast cancer 1 brother and 1 sister. Healthy  ALLERGIES:  has no allergies on file.  MEDICATIONS:  Current Outpatient Medications  Medication Sig Dispense Refill  . citalopram (CELEXA) 20 MG tablet TAKE 1 TABLET (20 MG TOTAL) BY MOUTH DAILY. 90 tablet 1  . gabapentin (NEURONTIN) 300 MG capsule Take 1 capsule (300 mg total) by mouth 2 (two) times daily. If no significant improvement after 1-2  weeks, can increase dose to 300mg  PO TID 90 capsule 4  . tamoxifen (NOLVADEX) 20 MG tablet Take 1 tablet (20 mg total) by mouth daily. 30 tablet 6   No current facility-administered medications for this visit.     Review of Systems  Constitutional:       Hot flashes. No periods since March 2017 when she started her tamoxifen. Good appetite.   HENT: Negative.   Eyes: Negative.   Respiratory: Negative.   Cardiovascular: Negative.   Gastrointestinal: Negative.   Genitourinary: Negative.   Skin: Negative.   Neurological: Positive for tingling  (neuropathy in fingertips).  Endo/Heme/Allergies: Negative.   Psychiatric/Behavioral: Negative.   All other systems reviewed and are negative. 14 point ROS was done and is otherwise as detailed above or in HPI  PHYSICAL EXAMINATION: ECOG PERFORMANCE STATUS: 0 - Asymptomatic  Vitals:   02/07/17 1030  BP: (!) 161/94  Pulse: 81  Resp: 16  Temp: 98.4 F (36.9 C)  SpO2: 97%     Physical Exam  Constitutional: She is oriented to person, place, and time and well-developed, well-nourished, and in no distress.  Pt was able to get on exam table without assistance.   HENT:  Head: Normocephalic and atraumatic.  Nose: Nose normal.  Mouth/Throat: Oropharynx is clear and moist. No oropharyngeal exudate.  Eyes: Conjunctivae and EOM are normal. Pupils are equal, round, and reactive to light. Right eye exhibits no discharge. Left eye exhibits no discharge. No scleral icterus.  Neck: Normal range of motion. Neck supple. No tracheal deviation present. No thyromegaly present.  Cardiovascular: Normal rate, regular rhythm and normal heart sounds. Exam reveals no gallop and no friction rub.  No murmur heard. Pulmonary/Chest: Effort normal and breath sounds normal. She has no wheezes. She has no rales.  Abdominal: Soft. Bowel sounds are normal. She exhibits no distension and no mass. There is no tenderness. There is no rebound and no guarding.  Musculoskeletal: Normal range of motion. She exhibits no edema.  Lymphadenopathy:    She has no cervical adenopathy.  Neurological: She is alert and oriented to person, place, and time. She has normal reflexes. No cranial nerve deficit. Gait normal. Coordination normal.  Skin: Skin is warm and dry. No rash noted.  Psychiatric: Mood, memory, affect and judgment normal.  Nursing note and vitals reviewed.   LABORATORY DATA:  I have reviewed the data as listed Lab Results  Component Value Date   WBC 5.9 02/07/2017   HGB 14.2 02/07/2017   HCT 43.8 02/07/2017     MCV 92.0 02/07/2017   PLT 226 02/07/2017   CMP     Component Value Date/Time   NA 139 02/07/2017 0957   K 4.0 02/07/2017 0957   CL 105 02/07/2017 0957   CO2 26 02/07/2017 0957   GLUCOSE 104 (H) 02/07/2017 0957   BUN 9 02/07/2017 0957   CREATININE 0.74 02/07/2017 0957   CALCIUM 9.2 02/07/2017 0957   PROT 6.8 02/07/2017 0957   ALBUMIN 3.6 02/07/2017 0957   AST 23 02/07/2017 0957   ALT 14 02/07/2017 0957   ALKPHOS 96 02/07/2017 0957   BILITOT 0.5 02/07/2017 0957   GFRNONAA >60 02/07/2017 0957   GFRAA >60 02/07/2017 0957     RADIOGRAPHIC STUDIES: I have personally reviewed the radiological images as listed and agreed with the findings in the report. Study Result   CLINICAL DATA:  History of malignant lumpectomy of the left breast in 2016.Annual re-evaluation.  EXAM: 2D DIGITAL DIAGNOSTIC BILATERAL MAMMOGRAM WITH  CAD AND ADJUNCT TOMO  COMPARISON:  Previous exam(s).  ACR Breast Density Category c: The breast tissue is heterogeneously dense, which may obscure small masses.  FINDINGS: There are mild post lumpectomy scarring changes within the superior left breast. There is no specific evidence for recurrent tumor or developing malignancy within either breast.  Mammographic images were processed with CAD.  IMPRESSION: No findings worrisome for recurrent tumor or developing malignancy.  RECOMMENDATION: Bilateral diagnostic mammography in 1 year.  I have discussed the findings and recommendations with the patient. Results were also provided in writing at the conclusion of the visit. If applicable, a reminder letter will be sent to the patient regarding the next appointment.  BI-RADS CATEGORY  1: Negative.   Electronically Signed   By: Altamese Cabal M.D.   On: 02/03/2016 10:06       PATHOLOGY    ASSESSMENT & PLAN:   High grade DCIS of the Left breast Lumpectomy Adjuvant XRT Tamoxifen March 2017 Hx Depression Neuropathy in  hands  Labs reviewed with the patient. Results noted above. CBC, CMP WNL. FSH and estradiol are still pending.   Clinically NED. Recent annual mammogram 02/01/17 was negative for malignancy. Breast exam not performed today given recent negative mammogram. Continue with tamoxifen.  She needs yearly eye and gynecological exams. Repeat bilateral diagnostic mammogram to be performed in 01/2018. Advised her to take 300 mg BID of gabapentin for her neuropathy in her finger tips, told her that this is not a narcotic, and she will not get addicted. Labs reviewed with the patient today; they are normal.  RTC in 6 months for follow up with labs. Breast exam on next visit.  NCCN guidelines recommends the following surveillance for DCIS of breast:  1. Interval history and physical exam every 6-12 months for 5 years, then annually.  2. Mammogram every 12 months (and 6-12 months postradiation therapy if breast conserved [Category 2B]).  3. If treated with Tamoxifen, monitor per NCCN guidelines for Breast Cancer Risk Reduction.  Orders Placed This Encounter  Procedures  . CBC with Differential    Standing Status:   Future    Standing Expiration Date:   02/07/2018  . Comprehensive metabolic panel    Standing Status:   Future    Standing Expiration Date:   02/07/2018    All questions were answered. The patient knows to call the clinic with any problems, questions or concerns.    This note was electronically signed.    Twana First, MD  02/07/2017 10:56 AM

## 2017-06-10 ENCOUNTER — Telehealth (HOSPITAL_COMMUNITY): Payer: Self-pay

## 2017-06-10 NOTE — Telephone Encounter (Signed)
Patient called stating she had been invited to a CBD oil party and wanted to be sure she could use this while on Tamoxifen. She anted to be sure the CBD oil would not interfere with her Tamoxifen. Reviewed with NP. Called patient back and had to leave a message. Explained that according to the provider there is just not enough data out there concerning CBD oil and tamoxifen. If she uses it, it will be at her own risk. Call the cancer center if any questions.

## 2017-07-14 ENCOUNTER — Other Ambulatory Visit (HOSPITAL_COMMUNITY): Payer: Self-pay | Admitting: Adult Health

## 2017-07-14 DIAGNOSIS — D0512 Intraductal carcinoma in situ of left breast: Secondary | ICD-10-CM

## 2017-07-14 NOTE — Telephone Encounter (Signed)
Just being sure you received this.. 

## 2017-08-05 ENCOUNTER — Other Ambulatory Visit (HOSPITAL_COMMUNITY): Payer: Self-pay | Admitting: *Deleted

## 2017-08-05 DIAGNOSIS — D0510 Intraductal carcinoma in situ of unspecified breast: Secondary | ICD-10-CM

## 2017-08-08 ENCOUNTER — Ambulatory Visit (HOSPITAL_COMMUNITY): Payer: Self-pay | Admitting: Hematology

## 2017-08-08 ENCOUNTER — Other Ambulatory Visit (HOSPITAL_COMMUNITY): Payer: Self-pay

## 2017-08-16 ENCOUNTER — Inpatient Hospital Stay (HOSPITAL_COMMUNITY): Payer: BLUE CROSS/BLUE SHIELD | Attending: Hematology

## 2017-08-16 DIAGNOSIS — D0512 Intraductal carcinoma in situ of left breast: Secondary | ICD-10-CM | POA: Diagnosis present

## 2017-08-16 DIAGNOSIS — D0510 Intraductal carcinoma in situ of unspecified breast: Secondary | ICD-10-CM

## 2017-08-16 LAB — COMPREHENSIVE METABOLIC PANEL
ALT: 17 U/L (ref 14–54)
ANION GAP: 9 (ref 5–15)
AST: 22 U/L (ref 15–41)
Albumin: 3.6 g/dL (ref 3.5–5.0)
Alkaline Phosphatase: 100 U/L (ref 38–126)
BUN: 11 mg/dL (ref 6–20)
CHLORIDE: 102 mmol/L (ref 101–111)
CO2: 24 mmol/L (ref 22–32)
CREATININE: 0.65 mg/dL (ref 0.44–1.00)
Calcium: 9 mg/dL (ref 8.9–10.3)
Glucose, Bld: 90 mg/dL (ref 65–99)
Potassium: 3.9 mmol/L (ref 3.5–5.1)
SODIUM: 135 mmol/L (ref 135–145)
Total Bilirubin: 0.6 mg/dL (ref 0.3–1.2)
Total Protein: 7 g/dL (ref 6.5–8.1)

## 2017-08-16 LAB — CBC WITH DIFFERENTIAL/PLATELET
BASOS ABS: 0 10*3/uL (ref 0.0–0.1)
Basophils Relative: 0 %
EOS ABS: 0.1 10*3/uL (ref 0.0–0.7)
EOS PCT: 2 %
HCT: 41.7 % (ref 36.0–46.0)
Hemoglobin: 13.9 g/dL (ref 12.0–15.0)
Lymphocytes Relative: 21 %
Lymphs Abs: 1.2 10*3/uL (ref 0.7–4.0)
MCH: 30 pg (ref 26.0–34.0)
MCHC: 33.3 g/dL (ref 30.0–36.0)
MCV: 90.1 fL (ref 78.0–100.0)
Monocytes Absolute: 0.5 10*3/uL (ref 0.1–1.0)
Monocytes Relative: 8 %
Neutro Abs: 3.9 10*3/uL (ref 1.7–7.7)
Neutrophils Relative %: 69 %
PLATELETS: 229 10*3/uL (ref 150–400)
RBC: 4.63 MIL/uL (ref 3.87–5.11)
RDW: 12.9 % (ref 11.5–15.5)
WBC: 5.7 10*3/uL (ref 4.0–10.5)

## 2017-08-17 LAB — FOLLICLE STIMULATING HORMONE: FSH: 30.1 m[IU]/mL

## 2017-08-17 LAB — ESTRADIOL

## 2017-08-22 ENCOUNTER — Other Ambulatory Visit: Payer: Self-pay

## 2017-08-22 ENCOUNTER — Encounter (HOSPITAL_COMMUNITY): Payer: Self-pay | Admitting: Hematology

## 2017-08-22 ENCOUNTER — Inpatient Hospital Stay (HOSPITAL_COMMUNITY): Payer: BLUE CROSS/BLUE SHIELD | Attending: Hematology | Admitting: Hematology

## 2017-08-22 DIAGNOSIS — Z79899 Other long term (current) drug therapy: Secondary | ICD-10-CM | POA: Diagnosis not present

## 2017-08-22 DIAGNOSIS — Z923 Personal history of irradiation: Secondary | ICD-10-CM

## 2017-08-22 DIAGNOSIS — D0512 Intraductal carcinoma in situ of left breast: Secondary | ICD-10-CM | POA: Diagnosis not present

## 2017-08-22 DIAGNOSIS — Z79810 Long term (current) use of selective estrogen receptor modulators (SERMs): Secondary | ICD-10-CM

## 2017-08-22 NOTE — Assessment & Plan Note (Signed)
1.  High-grade DCIS of the left breast: -Status post lumpectomy on 01/27/2015 with pathology showing DCIS, high-grade with calcifications. -Status post radiation therapy and tamoxifen started in March 2017 - Tolerates tamoxifen reasonably well.  Has hot flashes both during daytime and nighttime.  She is already on citalopram and has started taking gabapentin at nighttime for her cramping in her hands. -Today's physical examination did not reveal any palpable masses.  We will schedule her for mammogram in November.  We will see her back in 6 months for follow-up. -I have discussed the results of serum estradiol which was low and FSH which was high, indicating menopause.  She was having.'s at the time of her diagnosis.  She stopped menstruating since she was started on tamoxifen.  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

## 2017-08-22 NOTE — Progress Notes (Signed)
Patient Care Team: Caryl Bis, MD as PCP - General (Family Medicine)  DIAGNOSIS: Left breast DCIS Encounter Diagnosis  Name Primary?  . Ductal carcinoma in situ (DCIS) of left breast     CHIEF COMPLIANT: Follow-up for left breast DCIS.  INTERVAL HISTORY: Kristen Kaiser is a 52 year old very pleasant white female who is seen in follow-up visit for her left breast DCIS.  She is taking tamoxifen since March 2017.  She has some hot flashes particularly when she works, where the temperature is hot.  Denies any new onset pains.  Denies any vaginal discharge.  Some cramping in the hands noted.  This has improved since she started taking Neurontin at nighttime.  Denies any mood changes.  Denies any recent infections or hospitalizations.  REVIEW OF SYSTEMS:   Constitutional: Denies fevers, chills or abnormal weight loss.  Hot flashes present. Eyes: Denies blurriness of vision Ears, nose, mouth, throat, and face: Denies mucositis or sore throat Respiratory: Denies cough, dyspnea or wheezes Cardiovascular: Denies palpitation, chest discomfort Gastrointestinal:  Denies nausea, heartburn or change in bowel habits Skin: Denies abnormal skin rashes Lymphatics: Denies new lymphadenopathy or easy bruising Neurological:Denies numbness, tingling or new weaknesses Behavioral/Psych: Mood is stable, no new changes  Extremities: No lower extremity edema Breast:  denies any  lumps or nodules in either breasts.  She has sharp pain in the left breast at the lumpectomy site on and off. All other systems were reviewed with the patient and are negative.  I have reviewed the past medical history, past surgical history, social history and family history with the patient and they are unchanged from previous note.  ALLERGIES:  has no allergies on file.  MEDICATIONS:  Current Outpatient Medications  Medication Sig Dispense Refill  . citalopram (CELEXA) 20 MG tablet TAKE 1 TABLET BY MOUTH EVERY DAY 90 tablet 1    . gabapentin (NEURONTIN) 300 MG capsule Take 1 capsule (300 mg total) by mouth 2 (two) times daily. If no significant improvement after 1-2 weeks, can increase dose to 300mg  PO TID 90 capsule 4  . tamoxifen (NOLVADEX) 20 MG tablet TAKE 1 TABLET (20 MG TOTAL) BY MOUTH DAILY. 30 tablet 6   No current facility-administered medications for this visit.     PHYSICAL EXAMINATION: ECOG PERFORMANCE STATUS: 0 - Asymptomatic  Vitals:   08/22/17 1426  BP: (!) 145/94  Pulse: 79  Resp: 16  Temp: 98.1 F (36.7 C)  SpO2: 98%   Filed Weights   08/22/17 1426  Weight: 182 lb (82.6 kg)    GENERAL:alert, no distress and comfortable SKIN: skin color, texture, turgor are normal, no rashes or significant lesions EYES: normal, Conjunctiva are pink and non-injected, sclera clear OROPHARYNX:no mucositis, no erythema and lips, buccal mucosa, and tongue normal  NECK: supple, thyroid normal size, non-tender, without nodularity LYMPH:  no palpable lymphadenopathy in the cervical, axillary or inguinal LUNGS: clear to auscultation and percussion with normal breathing effort HEART: regular rate & rhythm and no murmurs and no lower extremity edema ABDOMEN:abdomen soft, non-tender and normal bowel sounds MUSCULOSKELETAL:no cyanosis of digits and no clubbing   EXTREMITIES: No lower extremity edema BREAST: Left breast lumpectomy scar in the lower outer quadrant is stable.  No palpable mass in bilateral breast.  No palpable axillary adenopathy.  LABORATORY DATA:  I have reviewed the data as listed CMP Latest Ref Rng & Units 08/16/2017 02/07/2017 07/26/2016  Glucose 65 - 99 mg/dL 90 104(H) 101(H)  BUN 6 - 20 mg/dL 11 9  11  Creatinine 0.44 - 1.00 mg/dL 0.65 0.74 0.69  Sodium 135 - 145 mmol/L 135 139 137  Potassium 3.5 - 5.1 mmol/L 3.9 4.0 3.6  Chloride 101 - 111 mmol/L 102 105 106  CO2 22 - 32 mmol/L 24 26 24   Calcium 8.9 - 10.3 mg/dL 9.0 9.2 9.1  Total Protein 6.5 - 8.1 g/dL 7.0 6.8 6.7  Total Bilirubin 0.3  - 1.2 mg/dL 0.6 0.5 0.5  Alkaline Phos 38 - 126 U/L 100 96 86  AST 15 - 41 U/L 22 23 23   ALT 14 - 54 U/L 17 14 17    No results found for: HYI502   Lab Results  Component Value Date   WBC 5.7 08/16/2017   HGB 13.9 08/16/2017   HCT 41.7 08/16/2017   MCV 90.1 08/16/2017   PLT 229 08/16/2017   NEUTROABS 3.9 08/16/2017    ASSESSMENT & PLAN:  Ductal carcinoma in situ (DCIS) of left breast 1.  High-grade DCIS of the left breast: -Status post lumpectomy on 01/27/2015 with pathology showing DCIS, high-grade with calcifications. -Status post radiation therapy and tamoxifen started in March 2017 - Tolerates tamoxifen reasonably well.  Has hot flashes both during daytime and nighttime.  She is already on citalopram and has started taking gabapentin at nighttime for her cramping in her hands. -Today's physical examination did not reveal any palpable masses.  We will schedule her for mammogram in November.  We will see her back in 6 months for follow-up. -I have discussed the results of serum estradiol which was low and FSH which was high, indicating menopause.  She was having.'s at the time of her diagnosis.  She stopped menstruating since she was started on tamoxifen.  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  Breast Cancer therapy associated bone loss: I have recommended calcium, Vitamin D and weight bearing exercises.    Orders Placed This Encounter  Procedures  . MM DIAG BREAST TOMO BILATERAL    Standing Status:   Future    Standing Expiration Date:   08/23/2018    Order Specific Question:   Reason for Exam (SYMPTOM  OR DIAGNOSIS REQUIRED)    Answer:   History of left breast DCIS    Order Specific Question:   Is the patient pregnant?    Answer:   No    Order Specific Question:   Preferred imaging location?    Answer:   [facility]   The patient has a good understanding of the overall plan. she agrees with it. she will call with any problems that may develop before the next visit  here.   Derek Jack, MD 08/22/17

## 2017-09-30 ENCOUNTER — Other Ambulatory Visit (HOSPITAL_COMMUNITY): Payer: Self-pay | Admitting: Hematology

## 2017-10-04 ENCOUNTER — Other Ambulatory Visit (HOSPITAL_COMMUNITY): Payer: Self-pay | Admitting: *Deleted

## 2017-10-04 MED ORDER — TAMOXIFEN CITRATE 20 MG PO TABS: 20.0000 mg | ORAL_TABLET | Freq: Every day | ORAL | 6 refills | Status: DC

## 2017-10-04 MED ORDER — GABAPENTIN 300 MG PO CAPS: 300.0000 mg | ORAL_CAPSULE | Freq: Every day | ORAL | 5 refills | Status: DC

## 2017-10-04 NOTE — Progress Notes (Signed)
I reviewed last office note, medications refilled.

## 2018-01-11 ENCOUNTER — Telehealth (HOSPITAL_COMMUNITY): Payer: Self-pay | Admitting: *Deleted

## 2018-01-11 ENCOUNTER — Other Ambulatory Visit (HOSPITAL_COMMUNITY): Payer: Self-pay | Admitting: *Deleted

## 2018-01-11 DIAGNOSIS — D0512 Intraductal carcinoma in situ of left breast: Secondary | ICD-10-CM

## 2018-01-11 MED ORDER — CITALOPRAM HYDROBROMIDE 20 MG PO TABS: 20.0000 mg | ORAL_TABLET | Freq: Every day | ORAL | 1 refills | Status: DC

## 2018-01-11 NOTE — Telephone Encounter (Signed)
Chart reviewed, citalopram refilled

## 2018-01-30 ENCOUNTER — Other Ambulatory Visit (HOSPITAL_COMMUNITY): Payer: Self-pay | Admitting: Hematology

## 2018-01-30 DIAGNOSIS — D0512 Intraductal carcinoma in situ of left breast: Secondary | ICD-10-CM

## 2018-02-07 ENCOUNTER — Ambulatory Visit (HOSPITAL_COMMUNITY): Payer: BLUE CROSS/BLUE SHIELD

## 2018-02-07 ENCOUNTER — Ambulatory Visit (HOSPITAL_COMMUNITY): Admission: RE | Admit: 2018-02-07 | Payer: BLUE CROSS/BLUE SHIELD | Source: Ambulatory Visit

## 2018-02-07 ENCOUNTER — Ambulatory Visit (HOSPITAL_COMMUNITY)
Admission: RE | Admit: 2018-02-07 | Discharge: 2018-02-07 | Disposition: A | Discharge status: Home / Self Care | Payer: BLUE CROSS/BLUE SHIELD | Source: Ambulatory Visit | Attending: Hematology | Admitting: Hematology

## 2018-02-07 ENCOUNTER — Encounter (HOSPITAL_COMMUNITY): Payer: Self-pay

## 2018-02-07 DIAGNOSIS — D0512 Intraductal carcinoma in situ of left breast: Secondary | ICD-10-CM | POA: Diagnosis present

## 2018-02-07 HISTORY — DX: Malignant neoplasm of unspecified site of unspecified female breast: C50.919

## 2018-02-07 HISTORY — DX: Personal history of irradiation: Z92.3

## 2018-02-15 ENCOUNTER — Other Ambulatory Visit (HOSPITAL_COMMUNITY): Payer: Self-pay

## 2018-02-15 DIAGNOSIS — D0512 Intraductal carcinoma in situ of left breast: Secondary | ICD-10-CM

## 2018-02-15 DIAGNOSIS — D0510 Intraductal carcinoma in situ of unspecified breast: Secondary | ICD-10-CM

## 2018-02-20 ENCOUNTER — Inpatient Hospital Stay (HOSPITAL_COMMUNITY): Payer: BLUE CROSS/BLUE SHIELD

## 2018-02-21 ENCOUNTER — Inpatient Hospital Stay (HOSPITAL_COMMUNITY): Payer: BLUE CROSS/BLUE SHIELD | Attending: Hematology

## 2018-02-21 DIAGNOSIS — Z79810 Long term (current) use of selective estrogen receptor modulators (SERMs): Secondary | ICD-10-CM | POA: Diagnosis not present

## 2018-02-21 DIAGNOSIS — D0512 Intraductal carcinoma in situ of left breast: Secondary | ICD-10-CM | POA: Diagnosis not present

## 2018-02-21 DIAGNOSIS — Z87891 Personal history of nicotine dependence: Secondary | ICD-10-CM | POA: Insufficient documentation

## 2018-02-21 DIAGNOSIS — Z923 Personal history of irradiation: Secondary | ICD-10-CM | POA: Diagnosis not present

## 2018-02-21 DIAGNOSIS — Z79899 Other long term (current) drug therapy: Secondary | ICD-10-CM | POA: Insufficient documentation

## 2018-02-21 DIAGNOSIS — D0510 Intraductal carcinoma in situ of unspecified breast: Secondary | ICD-10-CM

## 2018-02-21 LAB — COMPREHENSIVE METABOLIC PANEL
ALT: 18 U/L (ref 0–44)
AST: 22 U/L (ref 15–41)
Albumin: 3.9 g/dL (ref 3.5–5.0)
Alkaline Phosphatase: 91 U/L (ref 38–126)
Anion gap: 5 (ref 5–15)
BUN: 11 mg/dL (ref 6–20)
CHLORIDE: 108 mmol/L (ref 98–111)
CO2: 27 mmol/L (ref 22–32)
CREATININE: 0.73 mg/dL (ref 0.44–1.00)
Calcium: 9 mg/dL (ref 8.9–10.3)
GFR calc Af Amer: >60 mL/min (ref >60)
GFR calc non Af Amer: >60 mL/min (ref >60)
Glucose, Bld: 97 mg/dL (ref 70–99)
POTASSIUM: 4.6 mmol/L (ref 3.5–5.1)
SODIUM: 140 mmol/L (ref 135–145)
Total Bilirubin: 0.6 mg/dL (ref 0.3–1.2)
Total Protein: 7.1 g/dL (ref 6.5–8.1)

## 2018-02-21 LAB — CBC WITH DIFFERENTIAL/PLATELET
ABS IMMATURE GRANULOCYTES: 0.02 10*3/uL (ref 0.00–0.07)
BASOS PCT: 1 %
Basophils Absolute: 0 10*3/uL (ref 0.0–0.1)
EOS ABS: 0.1 10*3/uL (ref 0.0–0.5)
Eosinophils Relative: 2 %
HCT: 43.9 % (ref 36.0–46.0)
Hemoglobin: 14.3 g/dL (ref 12.0–15.0)
IMMATURE GRANULOCYTES: 0 %
Lymphocytes Relative: 26 %
Lymphs Abs: 1.4 10*3/uL (ref 0.7–4.0)
MCH: 29.8 pg (ref 26.0–34.0)
MCHC: 32.6 g/dL (ref 30.0–36.0)
MCV: 91.5 fL (ref 80.0–100.0)
Monocytes Absolute: 0.6 10*3/uL (ref 0.1–1.0)
Monocytes Relative: 11 %
NEUTROS ABS: 3.2 10*3/uL (ref 1.7–7.7)
NEUTROS PCT: 60 %
PLATELETS: 234 10*3/uL (ref 150–400)
RBC: 4.8 MIL/uL (ref 3.87–5.11)
RDW: 12.9 % (ref 11.5–15.5)
WBC: 5.3 10*3/uL (ref 4.0–10.5)
nRBC: 0 % (ref 0.0–0.2)

## 2018-02-27 ENCOUNTER — Other Ambulatory Visit: Payer: Self-pay

## 2018-02-27 ENCOUNTER — Inpatient Hospital Stay (HOSPITAL_COMMUNITY): Payer: BLUE CROSS/BLUE SHIELD | Admitting: Hematology

## 2018-02-27 ENCOUNTER — Encounter (HOSPITAL_COMMUNITY): Payer: Self-pay | Admitting: Hematology

## 2018-02-27 ENCOUNTER — Ambulatory Visit (HOSPITAL_COMMUNITY): Payer: Self-pay | Admitting: Hematology

## 2018-02-27 VITALS — BP 148/93 | HR 77 | Temp 97.8°F | Resp 16 | Wt 183.0 lb

## 2018-02-27 DIAGNOSIS — Z923 Personal history of irradiation: Secondary | ICD-10-CM | POA: Diagnosis not present

## 2018-02-27 DIAGNOSIS — Z87891 Personal history of nicotine dependence: Secondary | ICD-10-CM | POA: Diagnosis not present

## 2018-02-27 DIAGNOSIS — Z79810 Long term (current) use of selective estrogen receptor modulators (SERMs): Secondary | ICD-10-CM

## 2018-02-27 DIAGNOSIS — D0512 Intraductal carcinoma in situ of left breast: Secondary | ICD-10-CM

## 2018-02-27 DIAGNOSIS — Z79899 Other long term (current) drug therapy: Secondary | ICD-10-CM

## 2018-02-27 NOTE — Progress Notes (Signed)
[place], [place] [address]   CLINIC:  Medical Oncology/Hematology  PCP:  Kristen Bis, MD [address] [place] [address] [phone]   REASON FOR VISIT: Follow-up for Ductal carcinoma in situ (DCIS) of left breast  CURRENT THERAPY: Tamoxifen    INTERVAL HISTORY:  Kristen Kaiser 52 y.o. female returns for routine follow-up for left breast ductal carcinoma. She is here today and tolerating treatment well. She states she still get hot flashes that are pretty bad but she is dealing with them. She has not had a period since March when she started the tamoxifen. She denies any fevers or recent infections. Denies any bleeding or easy bruising. Denies any nausea, vomiting, or diarrhea. She reports her appetite at 100% and she has no problem maintaining her weight. Her energy is 30%.     REVIEW OF SYSTEMS:  Review of Systems  Endocrine: Positive for hot flashes.  All other systems reviewed and are negative.    PAST MEDICAL/SURGICAL HISTORY:  Past Medical History:  Diagnosis Date  . Breast cancer ([place]) 2016  . Personal history of radiation therapy 2016   Past Surgical History:  Procedure Laterality Date  . BREAST LUMPECTOMY Left 2016     SOCIAL HISTORY:  Social History   Socioeconomic History  . Marital status: Married    Spouse name: Not on file  . Number of children: Not on file  . Years of education: Not on file  . Highest education level: Not on file  Occupational History  . Not on file  Social Needs  . Financial resource strain: Not on file  . Food insecurity:    Worry: Not on file    Inability: Not on file  . Transportation needs:    Medical: Not on file    Non-medical: Not on file  Tobacco Use  . Smoking status: Former Smoker    Packs/day: 1.00    Years: 15.00    Pack years: 15.00    Types: Cigarettes    Last attempt to quit: 12/18/1998    Years since quitting: 19.2  . Smokeless tobacco: Never Used  Substance and  Sexual Activity  . Alcohol use: Not on file  . Drug use: Not on file  . Sexual activity: Not on file  Lifestyle  . Physical activity:    Days per week: Not on file    Minutes per session: Not on file  . Stress: Not on file  Relationships  . Social connections:    Talks on phone: Not on file    Gets together: Not on file    Attends religious service: Not on file    Active member of club or organization: Not on file    Attends meetings of clubs or organizations: Not on file    Relationship status: Not on file  . Intimate partner violence:    Fear of current or ex partner: Not on file    Emotionally abused: Not on file    Physically abused: Not on file    Forced sexual activity: Not on file  Other Topics Concern  . Not on file  Social History Narrative  . Not on file    FAMILY HISTORY:  History reviewed. No pertinent family history.  CURRENT MEDICATIONS:  Outpatient Encounter Medications as of 02/27/2018  Medication Sig  . citalopram (CELEXA) 20 MG tablet Take 1 tablet (20 mg total) by mouth daily.  Marland Kitchen gabapentin (NEURONTIN) 300 MG capsule Take  1 capsule (300 mg total) by mouth at bedtime.  . tamoxifen (NOLVADEX) 20 MG tablet Take 1 tablet (20 mg total) by mouth daily.   No facility-administered encounter medications on file as of 02/27/2018.     ALLERGIES:  Not on File   PHYSICAL EXAM:  ECOG Performance status: 1  Vitals:   02/27/18 1043  BP: (!) 148/93  Pulse: 77  Resp: 16  Temp: 97.8 F (36.6 C)  SpO2: 98%   Filed Weights   02/27/18 1043  Weight: 183 lb (83 kg)    Physical Exam  Constitutional: She is oriented to person, place, and time. She appears well-developed and well-nourished.  Cardiovascular: Normal rate, regular rhythm and normal heart sounds.  Pulmonary/Chest: Effort normal and breath sounds normal.  Musculoskeletal: Normal range of motion.  Neurological: She is alert and oriented to person, place, and time.  Skin: Skin is warm and dry.    Psychiatric: She has a normal mood and affect. Her behavior is normal. Judgment and thought content normal.  Breast:No palpable masses, no skin changes or nipple discharge, no adenopathy.   LABORATORY DATA:  I have reviewed the labs as listed.  CBC    Component Value Date/Time   WBC 5.3 02/21/2018 1204   RBC 4.80 02/21/2018 1204   HGB 14.3 02/21/2018 1204   HCT 43.9 02/21/2018 1204   PLT 234 02/21/2018 1204   MCV 91.5 02/21/2018 1204   MCH 29.8 02/21/2018 1204   MCHC 32.6 02/21/2018 1204   RDW 12.9 02/21/2018 1204   LYMPHSABS 1.4 02/21/2018 1204   MONOABS 0.6 02/21/2018 1204   EOSABS 0.1 02/21/2018 1204   BASOSABS 0.0 02/21/2018 1204   CMP Latest Ref Rng & Units 02/21/2018 08/16/2017 02/07/2017  Glucose 70 - 99 mg/dL 97 90 104(H)  BUN 6 - 20 mg/dL 11 11 9   Creatinine 0.44 - 1.00 mg/dL 0.73 0.65 0.74  Sodium 135 - 145 mmol/L 140 135 139  Potassium 3.5 - 5.1 mmol/L 4.6 3.9 4.0  Chloride 98 - 111 mmol/L 108 102 105  CO2 22 - 32 mmol/L 27 24 26   Calcium 8.9 - 10.3 mg/dL 9.0 9.0 9.2  Total Protein 6.5 - 8.1 g/dL 7.1 7.0 6.8  Total Bilirubin 0.3 - 1.2 mg/dL 0.6 0.6 0.5  Alkaline Phos 38 - 126 U/L 91 100 96  AST 15 - 41 U/L 22 22 23   ALT 0 - 44 U/L 18 17 14        DIAGNOSTIC IMAGING:  I have independently reviewed the scans and discussed with the patient.   I have reviewed Kristen Finders, NP's note and agree with the documentation.  I personally performed a face-to-face visit, made revisions and my assessment and plan is as follows.     ASSESSMENT & PLAN:   Ductal carcinoma in situ (DCIS) of left breast 1.  High-grade DCIS of the left breast: -Status post lumpectomy on 01/27/2015 with pathology showing DCIS, high-grade with calcifications. -Status post radiation therapy and tamoxifen started in March 2017 - Tolerates tamoxifen reasonably well.  Has hot flashes both during daytime and nighttime.  She is already on citalopram and has started taking gabapentin at nighttime  for her cramping in her hands. - She stopped menstruating when she started tamoxifen. -Today's physical examination did not reveal any suspicious masses.  Left lumpectomy site has not changed. -We reviewed mammogram dated 02/07/2018 which shows BI-RADS Category 2. -We also reviewed her blood work which was within normal limits. -She will continue tamoxifen for a  total of 5 years. -She will be seen back in 6 months for follow-up.      Orders placed this encounter:  Orders Placed This Encounter  Procedures  . CBC with Differential/Platelet  . Comprehensive metabolic panel      Derek Jack, MD [place] [phone]

## 2018-02-27 NOTE — Patient Instructions (Signed)
[place] at [facility] Discharge Instructions  Follow up in 6 months with labs Start taking calcium and Vit D daily.    Thank you for choosing [place] at [facility] to provide your oncology and hematology care.  To afford each patient quality time with our provider, please arrive at least 15 minutes before your scheduled appointment time.   If you have a lab appointment with the [place] please come in thru the  Main Entrance and check in at the main information desk  You need to re-schedule your appointment should you arrive 10 or more minutes late.  We strive to give you quality time with our providers, and arriving late affects you and other patients whose appointments are after yours.  Also, if you no show three or more times for appointments you may be dismissed from the clinic at the providers discretion.     Again, thank you for choosing [facility].  Our hope is that these requests will decrease the amount of time that you wait before being seen by our physicians.       _____________________________________________________________  Should you have questions after your visit to [facility], please contact our office at [phone] between the hours of 8:00 a.m. and 4:30 p.m.  Voicemails left after 4:00 p.m. will not be returned until the following business day.  For prescription refill requests, have your pharmacy contact our office and allow 72 hours.    Cancer Center Support Programs:   > Cancer Support Group  2nd Tuesday of the month 1pm-2pm, Journey Room

## 2018-02-27 NOTE — Assessment & Plan Note (Signed)
1.  High-grade DCIS of the left breast: -Status post lumpectomy on 01/27/2015 with pathology showing DCIS, high-grade with calcifications. -Status post radiation therapy and tamoxifen started in March 2017 - Tolerates tamoxifen reasonably well.  Has hot flashes both during daytime and nighttime.  She is already on citalopram and has started taking gabapentin at nighttime for her cramping in her hands. - She stopped menstruating when she started tamoxifen. -Today's physical examination did not reveal any suspicious masses.  Left lumpectomy site has not changed. -We reviewed mammogram dated 02/07/2018 which shows BI-RADS Category 2. -We also reviewed her blood work which was within normal limits. -She will continue tamoxifen for a total of 5 years. -She will be seen back in 6 months for follow-up.

## 2018-03-31 ENCOUNTER — Other Ambulatory Visit (HOSPITAL_COMMUNITY): Payer: Self-pay | Admitting: Hematology

## 2018-04-13 ENCOUNTER — Other Ambulatory Visit (HOSPITAL_COMMUNITY): Payer: Self-pay | Admitting: *Deleted

## 2018-04-13 ENCOUNTER — Other Ambulatory Visit (HOSPITAL_COMMUNITY): Payer: Self-pay | Admitting: Emergency Medicine

## 2018-04-13 MED ORDER — GABAPENTIN 300 MG PO CAPS: 300.0000 mg | ORAL_CAPSULE | Freq: Every day | ORAL | 5 refills | Status: DC

## 2018-04-23 ENCOUNTER — Other Ambulatory Visit (HOSPITAL_COMMUNITY): Payer: Self-pay | Admitting: Hematology

## 2018-05-04 ENCOUNTER — Other Ambulatory Visit (HOSPITAL_COMMUNITY): Payer: Self-pay | Admitting: Emergency Medicine

## 2018-05-04 MED ORDER — TAMOXIFEN CITRATE 20 MG PO TABS: 20.0000 mg | ORAL_TABLET | Freq: Every day | ORAL | 6 refills | Status: DC

## 2018-07-03 ENCOUNTER — Other Ambulatory Visit (HOSPITAL_COMMUNITY): Payer: Self-pay | Admitting: Hematology

## 2018-07-03 DIAGNOSIS — D0512 Intraductal carcinoma in situ of left breast: Secondary | ICD-10-CM

## 2018-08-28 ENCOUNTER — Other Ambulatory Visit (HOSPITAL_COMMUNITY): Payer: Self-pay

## 2018-09-04 ENCOUNTER — Ambulatory Visit (HOSPITAL_COMMUNITY): Payer: Self-pay | Admitting: Hematology

## 2018-09-11 ENCOUNTER — Other Ambulatory Visit: Payer: Self-pay

## 2018-09-11 ENCOUNTER — Inpatient Hospital Stay (HOSPITAL_COMMUNITY): Payer: PRIVATE HEALTH INSURANCE | Attending: Nurse Practitioner

## 2018-09-11 DIAGNOSIS — Z79899 Other long term (current) drug therapy: Secondary | ICD-10-CM | POA: Insufficient documentation

## 2018-09-11 DIAGNOSIS — Z923 Personal history of irradiation: Secondary | ICD-10-CM | POA: Insufficient documentation

## 2018-09-11 DIAGNOSIS — N951 Menopausal and female climacteric states: Secondary | ICD-10-CM | POA: Insufficient documentation

## 2018-09-11 DIAGNOSIS — I10 Essential (primary) hypertension: Secondary | ICD-10-CM | POA: Diagnosis not present

## 2018-09-11 DIAGNOSIS — D0512 Intraductal carcinoma in situ of left breast: Secondary | ICD-10-CM | POA: Insufficient documentation

## 2018-09-11 DIAGNOSIS — Z87891 Personal history of nicotine dependence: Secondary | ICD-10-CM | POA: Diagnosis not present

## 2018-09-11 DIAGNOSIS — Z79810 Long term (current) use of selective estrogen receptor modulators (SERMs): Secondary | ICD-10-CM | POA: Diagnosis not present

## 2018-09-11 LAB — COMPREHENSIVE METABOLIC PANEL
ALT: 16 U/L (ref 0–44)
AST: 19 U/L (ref 15–41)
Albumin: 3.7 g/dL (ref 3.5–5.0)
Alkaline Phosphatase: 92 U/L (ref 38–126)
Anion gap: 9 (ref 5–15)
BUN: 13 mg/dL (ref 6–20)
CO2: 25 mmol/L (ref 22–32)
Calcium: 8.9 mg/dL (ref 8.9–10.3)
Chloride: 105 mmol/L (ref 98–111)
Creatinine, Ser: 0.71 mg/dL (ref 0.44–1.00)
GFR calc Af Amer: >60 mL/min (ref >60)
GFR calc non Af Amer: >60 mL/min (ref >60)
Glucose, Bld: 123 mg/dL — ABNORMAL HIGH (ref 70–99)
Potassium: 4.1 mmol/L (ref 3.5–5.1)
Sodium: 139 mmol/L (ref 135–145)
Total Bilirubin: 0.6 mg/dL (ref 0.3–1.2)
Total Protein: 6.8 g/dL (ref 6.5–8.1)

## 2018-09-11 LAB — CBC WITH DIFFERENTIAL/PLATELET
Abs Immature Granulocytes: 0.02 10*3/uL (ref 0.00–0.07)
Basophils Absolute: 0 10*3/uL (ref 0.0–0.1)
Basophils Relative: 1 %
Eosinophils Absolute: 0.1 10*3/uL (ref 0.0–0.5)
Eosinophils Relative: 2 %
HCT: 43.5 % (ref 36.0–46.0)
Hemoglobin: 14.2 g/dL (ref 12.0–15.0)
Immature Granulocytes: 0 %
Lymphocytes Relative: 25 %
Lymphs Abs: 1.3 10*3/uL (ref 0.7–4.0)
MCH: 30 pg (ref 26.0–34.0)
MCHC: 32.6 g/dL (ref 30.0–36.0)
MCV: 91.8 fL (ref 80.0–100.0)
Monocytes Absolute: 0.4 10*3/uL (ref 0.1–1.0)
Monocytes Relative: 8 %
Neutro Abs: 3.4 10*3/uL (ref 1.7–7.7)
Neutrophils Relative %: 64 %
Platelets: 233 10*3/uL (ref 150–400)
RBC: 4.74 MIL/uL (ref 3.87–5.11)
RDW: 12.9 % (ref 11.5–15.5)
WBC: 5.3 10*3/uL (ref 4.0–10.5)
nRBC: 0 % (ref 0.0–0.2)

## 2018-09-18 ENCOUNTER — Encounter (HOSPITAL_COMMUNITY): Payer: Self-pay | Admitting: Hematology

## 2018-09-18 ENCOUNTER — Other Ambulatory Visit: Payer: Self-pay

## 2018-09-18 ENCOUNTER — Inpatient Hospital Stay (HOSPITAL_BASED_OUTPATIENT_CLINIC_OR_DEPARTMENT_OTHER): Payer: PRIVATE HEALTH INSURANCE | Admitting: Hematology

## 2018-09-18 VITALS — BP 165/92 | HR 87 | Temp 97.7°F | Resp 16 | Wt 183.1 lb

## 2018-09-18 DIAGNOSIS — N951 Menopausal and female climacteric states: Secondary | ICD-10-CM

## 2018-09-18 DIAGNOSIS — Z923 Personal history of irradiation: Secondary | ICD-10-CM

## 2018-09-18 DIAGNOSIS — D0512 Intraductal carcinoma in situ of left breast: Secondary | ICD-10-CM | POA: Diagnosis not present

## 2018-09-18 DIAGNOSIS — I10 Essential (primary) hypertension: Secondary | ICD-10-CM | POA: Diagnosis not present

## 2018-09-18 DIAGNOSIS — Z780 Asymptomatic menopausal state: Secondary | ICD-10-CM

## 2018-09-18 DIAGNOSIS — Z79810 Long term (current) use of selective estrogen receptor modulators (SERMs): Secondary | ICD-10-CM

## 2018-09-18 DIAGNOSIS — Z79899 Other long term (current) drug therapy: Secondary | ICD-10-CM

## 2018-09-18 DIAGNOSIS — Z87891 Personal history of nicotine dependence: Secondary | ICD-10-CM

## 2018-09-18 NOTE — Progress Notes (Signed)
[place], [place] [address]   CLINIC:  Medical Oncology/Hematology  PCP:  Caryl Bis, MD [address] [place] [address] [phone]   REASON FOR VISIT: Follow-up for Ductal carcinoma in situ (DCIS) of left breast  CURRENT THERAPY: Tamoxifen    INTERVAL HISTORY:  Kristen Kaiser 53 y.o. female here for follow-up of left breast DCIS.  She is taking tamoxifen without fail.  Appetite is 100%.  Energy levels are 75%.  She is taking Celexa 20 mg daily for anxiety and hot flashes.  She also takes gabapentin 300 mg at bedtime.  Gabapentin makes her slightly drowsy.  Denies any new onset pains.  Denies any musculoskeletal symptoms.  Takes vitamin D daily.  She has not been menstruating since she started tamoxifen.  Denies any infections or fevers.  Denies any recent ER visits or hospitalizations.    REVIEW OF SYSTEMS:  Review of Systems  Endocrine: Positive for hot flashes.  All other systems reviewed and are negative.    PAST MEDICAL/SURGICAL HISTORY:  Past Medical History:  Diagnosis Date  . Breast cancer ([place]) 2016  . Personal history of radiation therapy 2016   Past Surgical History:  Procedure Laterality Date  . BREAST LUMPECTOMY Left 2016     SOCIAL HISTORY:  Social History   Socioeconomic History  . Marital status: Married    Spouse name: Not on file  . Number of children: Not on file  . Years of education: Not on file  . Highest education level: Not on file  Occupational History  . Not on file  Social Needs  . Financial resource strain: Not on file  . Food insecurity    Worry: Not on file    Inability: Not on file  . Transportation needs    Medical: Not on file    Non-medical: Not on file  Tobacco Use  . Smoking status: Former Smoker    Packs/day: 1.00    Years: 15.00    Pack years: 15.00    Types: Cigarettes    Quit date: 12/18/1998    Years since quitting: 19.7  . Smokeless tobacco: Never Used  Substance and  Sexual Activity  . Alcohol use: Not on file  . Drug use: Not on file  . Sexual activity: Not on file  Lifestyle  . Physical activity    Days per week: Not on file    Minutes per session: Not on file  . Stress: Not on file  Relationships  . Social Herbalist on phone: Not on file    Gets together: Not on file    Attends religious service: Not on file    Active member of club or organization: Not on file    Attends meetings of clubs or organizations: Not on file    Relationship status: Not on file  . Intimate partner violence    Fear of current or ex partner: Not on file    Emotionally abused: Not on file    Physically abused: Not on file    Forced sexual activity: Not on file  Other Topics Concern  . Not on file  Social History Narrative  . Not on file    FAMILY HISTORY:  History reviewed. No pertinent family history.  CURRENT MEDICATIONS:  Outpatient Encounter Medications as of 09/18/2018  Medication Sig  . citalopram (CELEXA) 20 MG tablet TAKE 1 TABLET BY MOUTH EVERY DAY  . gabapentin (NEURONTIN) 300 MG  capsule Take 1 capsule (300 mg total) by mouth at bedtime.  . tamoxifen (NOLVADEX) 20 MG tablet Take 1 tablet (20 mg total) by mouth daily.   No facility-administered encounter medications on file as of 09/18/2018.     ALLERGIES:  Not on File   PHYSICAL EXAM:  ECOG Performance status: 1  Vitals:   09/18/18 1000  BP: (!) 165/92  Pulse: 87  Resp: 16  Temp: 97.7 F (36.5 C)  SpO2: 98%   Filed Weights   09/18/18 1000  Weight: 183 lb 1 oz (83 kg)    Physical Exam Constitutional:      Appearance: She is well-developed.  Cardiovascular:     Rate and Rhythm: Normal rate and regular rhythm.     Heart sounds: Normal heart sounds.  Pulmonary:     Effort: Pulmonary effort is normal.     Breath sounds: Normal breath sounds.  Abdominal:     General: There is no distension.     Palpations: Abdomen is soft. There is no mass.  Musculoskeletal: Normal  range of motion.  Skin:    General: Skin is warm.  Neurological:     Mental Status: She is alert and oriented to person, place, and time.  Psychiatric:        Behavior: Behavior normal.        Thought Content: Thought content normal.        Judgment: Judgment normal.   Breast exam: No palpable mass in bilateral breast.  Left breast lumpectomy scar in the upper outer quadrant is unchanged.  No palpable adenopathy.   LABORATORY DATA:  I have reviewed the labs as listed.  CBC    Component Value Date/Time   WBC 5.3 09/11/2018 1011   RBC 4.74 09/11/2018 1011   HGB 14.2 09/11/2018 1011   HCT 43.5 09/11/2018 1011   PLT 233 09/11/2018 1011   MCV 91.8 09/11/2018 1011   MCH 30.0 09/11/2018 1011   MCHC 32.6 09/11/2018 1011   RDW 12.9 09/11/2018 1011   LYMPHSABS 1.3 09/11/2018 1011   MONOABS 0.4 09/11/2018 1011   EOSABS 0.1 09/11/2018 1011   BASOSABS 0.0 09/11/2018 1011   CMP Latest Ref Rng & Units 09/11/2018 02/21/2018 08/16/2017  Glucose 70 - 99 mg/dL 123(H) 97 90  BUN 6 - 20 mg/dL 13 11 11   Creatinine 0.44 - 1.00 mg/dL 0.71 0.73 0.65  Sodium 135 - 145 mmol/L 139 140 135  Potassium 3.5 - 5.1 mmol/L 4.1 4.6 3.9  Chloride 98 - 111 mmol/L 105 108 102  CO2 22 - 32 mmol/L 25 27 24   Calcium 8.9 - 10.3 mg/dL 8.9 9.0 9.0  Total Protein 6.5 - 8.1 g/dL 6.8 7.1 7.0  Total Bilirubin 0.3 - 1.2 mg/dL 0.6 0.6 0.6  Alkaline Phos 38 - 126 U/L 92 91 100  AST 15 - 41 U/L 19 22 22   ALT 0 - 44 U/L 16 18 17        DIAGNOSTIC IMAGING:  I have independently reviewed the scans and discussed with the patient.     ASSESSMENT & PLAN:   Ductal carcinoma in situ (DCIS) of left breast 1.  High-grade DCIS of the left breast: -Status post lumpectomy on 01/27/2015 with pathology showing DCIS, high-grade with calcifications. -Status post radiation therapy and tamoxifen started in March 2017. - She stopped menstruating when she was started on tamoxifen. -Last mammogram on 02/07/2018 shows BI-RADS  Category 2. - Today's physical examination did not reveal any suspicious masses.  Left lumpectomy  scar in the upper outer quadrant is unchanged. - We reviewed her blood work which is within normal limits.  She will continue tamoxifen for total of 5 years. - I will schedule her for next mammogram in November.  We will see her back in 6 months for follow-up.  2.  Hot flashes: - She is taking Celexa 20 mg daily.  She is also taking gabapentin 300 mg at nighttime. -She is continuing to have hot flashes both during daytime and nighttime. - I will increase her Celexa to 40 mg daily and see if it helps.  Otherwise will consider increasing gabapentin to more than once a day.  3.  Bone health: -She is taking vitamin D supplements.  We will obtain a baseline bone density test prior to next visit.  4.  Hypertension: -Her blood pressure today is high at 165/92. -She never had a history of hypertension.  I have told her to check blood pressure at home. -If it remains high, she was advised to see Dr. Olena Heckle.   Total time spent is 25 minutes with more than 50% of the time spent face-to-face discussing treatment plan, counseling and coordination of care.  Orders placed this encounter:  Orders Placed This Encounter  Procedures  . MM DIAG BREAST TOMO BILATERAL  . DG Bone Density      Derek Jack, MD [place] [phone]

## 2018-09-18 NOTE — Assessment & Plan Note (Signed)
1.  High-grade DCIS of the left breast: -Status post lumpectomy on 01/27/2015 with pathology showing DCIS, high-grade with calcifications. -Status post radiation therapy and tamoxifen started in March 2017. - She stopped menstruating when she was started on tamoxifen. -Last mammogram on 02/07/2018 shows BI-RADS Category 2. - Today's physical examination did not reveal any suspicious masses.  Left lumpectomy scar in the upper outer quadrant is unchanged. - We reviewed her blood work which is within normal limits.  She will continue tamoxifen for total of 5 years. - I will schedule her for next mammogram in November.  We will see her back in 6 months for follow-up.  2.  Hot flashes: - She is taking Celexa 20 mg daily.  She is also taking gabapentin 300 mg at nighttime. -She is continuing to have hot flashes both during daytime and nighttime. - I will increase her Celexa to 40 mg daily and see if it helps.  Otherwise will consider increasing gabapentin to more than once a day.  3.  Bone health: -She is taking vitamin D supplements.  We will obtain a baseline bone density test prior to next visit.  4.  Hypertension: -Her blood pressure today is high at 165/92. -She never had a history of hypertension.  I have told her to check blood pressure at home. -If it remains high, she was advised to see Dr. Olena Heckle.

## 2018-09-18 NOTE — Patient Instructions (Addendum)
[facility] at [facility] Discharge Instructions  You were seen today by Dr. Katragadda. He went over your recent lab results. He will see you back in 6 months for labs and follow up.   Thank you for choosing [facility] at [facility] to provide your oncology and hematology care.  To afford each patient quality time with our provider, please arrive at least 15 minutes before your scheduled appointment time.   If you have a lab appointment with the Cancer Center please come in thru the  Main Entrance and check in at the main information desk  You need to re-schedule your appointment should you arrive 10 or more minutes late.  We strive to give you quality time with our providers, and arriving late affects you and other patients whose appointments are after yours.  Also, if you no show three or more times for appointments you may be dismissed from the clinic at the providers discretion.     Again, thank you for choosing [facility].  Our hope is that these requests will decrease the amount of time that you wait before being seen by our physicians.       _____________________________________________________________  Should you have questions after your visit to [facility], please contact our office at [phone] between the hours of 8:00 a.m. and 4:30 p.m.  Voicemails left after 4:00 p.m. will not be returned until the following business day.  For prescription refill requests, have your pharmacy contact our office and allow 72 hours.    Cancer Center Support Programs:   > Cancer Support Group  2nd Tuesday of the month 1pm-2pm, Journey Room    

## 2018-09-26 ENCOUNTER — Other Ambulatory Visit (HOSPITAL_COMMUNITY): Payer: Self-pay | Admitting: *Deleted

## 2018-09-26 DIAGNOSIS — D0512 Intraductal carcinoma in situ of left breast: Secondary | ICD-10-CM

## 2018-09-26 MED ORDER — CITALOPRAM HYDROBROMIDE 40 MG PO TABS: 20.0000 mg | ORAL_TABLET | Freq: Every day | ORAL | 2 refills | Status: DC

## 2018-09-26 NOTE — Telephone Encounter (Signed)
Per Dr. Tomie China last office note, celexa increased to 40 mg once daily.

## 2018-10-05 ENCOUNTER — Other Ambulatory Visit (HOSPITAL_COMMUNITY): Payer: Self-pay | Admitting: Nurse Practitioner

## 2018-10-10 ENCOUNTER — Other Ambulatory Visit (HOSPITAL_COMMUNITY): Payer: Self-pay | Admitting: *Deleted

## 2018-10-10 DIAGNOSIS — D0512 Intraductal carcinoma in situ of left breast: Secondary | ICD-10-CM

## 2018-10-10 MED ORDER — CITALOPRAM HYDROBROMIDE 40 MG PO TABS: 40.0000 mg | ORAL_TABLET | Freq: Every day | ORAL | 2 refills | Status: DC

## 2018-11-30 ENCOUNTER — Other Ambulatory Visit (HOSPITAL_COMMUNITY): Payer: Self-pay | Admitting: Nurse Practitioner

## 2019-02-12 ENCOUNTER — Other Ambulatory Visit (HOSPITAL_COMMUNITY): Payer: Self-pay | Admitting: Hematology

## 2019-02-12 DIAGNOSIS — R928 Other abnormal and inconclusive findings on diagnostic imaging of breast: Secondary | ICD-10-CM

## 2019-02-20 ENCOUNTER — Encounter (HOSPITAL_COMMUNITY): Payer: Medicaid Other

## 2019-02-20 ENCOUNTER — Ambulatory Visit (HOSPITAL_COMMUNITY): Payer: PRIVATE HEALTH INSURANCE

## 2019-02-20 ENCOUNTER — Other Ambulatory Visit (HOSPITAL_COMMUNITY): Payer: Medicaid Other

## 2019-03-06 ENCOUNTER — Other Ambulatory Visit (HOSPITAL_COMMUNITY): Payer: PRIVATE HEALTH INSURANCE

## 2019-03-06 ENCOUNTER — Other Ambulatory Visit (HOSPITAL_COMMUNITY): Payer: Medicaid Other

## 2019-03-06 ENCOUNTER — Encounter (HOSPITAL_COMMUNITY): Payer: Medicaid Other

## 2019-03-15 ENCOUNTER — Other Ambulatory Visit (HOSPITAL_COMMUNITY): Payer: Self-pay

## 2019-03-15 DIAGNOSIS — D0512 Intraductal carcinoma in situ of left breast: Secondary | ICD-10-CM

## 2019-03-19 ENCOUNTER — Inpatient Hospital Stay (HOSPITAL_COMMUNITY): Payer: PRIVATE HEALTH INSURANCE

## 2019-03-20 ENCOUNTER — Encounter (HOSPITAL_COMMUNITY): Payer: Medicaid Other

## 2019-03-20 ENCOUNTER — Other Ambulatory Visit (HOSPITAL_COMMUNITY): Payer: Medicaid Other

## 2019-03-27 ENCOUNTER — Other Ambulatory Visit (HOSPITAL_COMMUNITY): Payer: Medicaid Other

## 2019-03-27 ENCOUNTER — Ambulatory Visit (HOSPITAL_COMMUNITY): Payer: PRIVATE HEALTH INSURANCE

## 2019-03-27 ENCOUNTER — Inpatient Hospital Stay (HOSPITAL_COMMUNITY): Payer: Medicaid Other

## 2019-03-27 ENCOUNTER — Encounter (HOSPITAL_COMMUNITY): Payer: Medicaid Other

## 2019-04-02 ENCOUNTER — Ambulatory Visit (HOSPITAL_COMMUNITY): Payer: Medicaid Other | Admitting: Hematology

## 2019-04-05 ENCOUNTER — Other Ambulatory Visit (HOSPITAL_COMMUNITY): Payer: Self-pay | Admitting: Nurse Practitioner

## 2019-04-24 ENCOUNTER — Other Ambulatory Visit: Payer: Self-pay

## 2019-04-24 ENCOUNTER — Ambulatory Visit (HOSPITAL_COMMUNITY)
Admission: RE | Admit: 2019-04-24 | Discharge: 2019-04-24 | Disposition: A | Discharge status: Home / Self Care | Payer: 59 | Source: Ambulatory Visit | Attending: Hematology | Admitting: Hematology

## 2019-04-24 ENCOUNTER — Inpatient Hospital Stay (HOSPITAL_COMMUNITY): Payer: 59 | Attending: Hematology

## 2019-04-24 ENCOUNTER — Encounter (HOSPITAL_COMMUNITY): Payer: PRIVATE HEALTH INSURANCE

## 2019-04-24 DIAGNOSIS — R928 Other abnormal and inconclusive findings on diagnostic imaging of breast: Secondary | ICD-10-CM

## 2019-04-24 DIAGNOSIS — R232 Flushing: Secondary | ICD-10-CM | POA: Insufficient documentation

## 2019-04-24 DIAGNOSIS — Z79899 Other long term (current) drug therapy: Secondary | ICD-10-CM | POA: Insufficient documentation

## 2019-04-24 DIAGNOSIS — Z923 Personal history of irradiation: Secondary | ICD-10-CM | POA: Insufficient documentation

## 2019-04-24 DIAGNOSIS — Z79810 Long term (current) use of selective estrogen receptor modulators (SERMs): Secondary | ICD-10-CM | POA: Insufficient documentation

## 2019-04-24 DIAGNOSIS — Z780 Asymptomatic menopausal state: Secondary | ICD-10-CM | POA: Diagnosis not present

## 2019-04-24 DIAGNOSIS — D0512 Intraductal carcinoma in situ of left breast: Secondary | ICD-10-CM | POA: Insufficient documentation

## 2019-04-24 DIAGNOSIS — I10 Essential (primary) hypertension: Secondary | ICD-10-CM | POA: Insufficient documentation

## 2019-04-24 DIAGNOSIS — M8580 Other specified disorders of bone density and structure, unspecified site: Secondary | ICD-10-CM | POA: Insufficient documentation

## 2019-04-24 DIAGNOSIS — Z87891 Personal history of nicotine dependence: Secondary | ICD-10-CM | POA: Insufficient documentation

## 2019-04-27 ENCOUNTER — Other Ambulatory Visit (HOSPITAL_COMMUNITY): Payer: 59

## 2019-04-27 ENCOUNTER — Other Ambulatory Visit (HOSPITAL_COMMUNITY): Payer: Self-pay

## 2019-04-27 DIAGNOSIS — D0512 Intraductal carcinoma in situ of left breast: Secondary | ICD-10-CM

## 2019-04-30 ENCOUNTER — Inpatient Hospital Stay (HOSPITAL_COMMUNITY): Payer: 59

## 2019-04-30 ENCOUNTER — Other Ambulatory Visit: Payer: Self-pay

## 2019-04-30 ENCOUNTER — Inpatient Hospital Stay (HOSPITAL_BASED_OUTPATIENT_CLINIC_OR_DEPARTMENT_OTHER): Payer: 59 | Admitting: Hematology

## 2019-04-30 ENCOUNTER — Encounter (HOSPITAL_COMMUNITY): Payer: Self-pay | Admitting: Hematology

## 2019-04-30 DIAGNOSIS — I10 Essential (primary) hypertension: Secondary | ICD-10-CM | POA: Diagnosis not present

## 2019-04-30 DIAGNOSIS — R232 Flushing: Secondary | ICD-10-CM | POA: Diagnosis not present

## 2019-04-30 DIAGNOSIS — M8580 Other specified disorders of bone density and structure, unspecified site: Secondary | ICD-10-CM | POA: Diagnosis not present

## 2019-04-30 DIAGNOSIS — Z923 Personal history of irradiation: Secondary | ICD-10-CM | POA: Diagnosis not present

## 2019-04-30 DIAGNOSIS — D0512 Intraductal carcinoma in situ of left breast: Secondary | ICD-10-CM

## 2019-04-30 DIAGNOSIS — Z87891 Personal history of nicotine dependence: Secondary | ICD-10-CM | POA: Diagnosis not present

## 2019-04-30 DIAGNOSIS — Z79899 Other long term (current) drug therapy: Secondary | ICD-10-CM | POA: Diagnosis not present

## 2019-04-30 DIAGNOSIS — Z79810 Long term (current) use of selective estrogen receptor modulators (SERMs): Secondary | ICD-10-CM | POA: Diagnosis not present

## 2019-04-30 LAB — CBC WITH DIFFERENTIAL/PLATELET
Abs Immature Granulocytes: 0.01 10*3/uL (ref 0.00–0.07)
Basophils Absolute: 0.1 10*3/uL (ref 0.0–0.1)
Basophils Relative: 1 %
Eosinophils Absolute: 0.1 10*3/uL (ref 0.0–0.5)
Eosinophils Relative: 2 %
HCT: 41.6 % (ref 36.0–46.0)
Hemoglobin: 14.1 g/dL (ref 12.0–15.0)
Immature Granulocytes: 0 %
Lymphocytes Relative: 24 %
Lymphs Abs: 1.2 10*3/uL (ref 0.7–4.0)
MCH: 31.1 pg (ref 26.0–34.0)
MCHC: 33.9 g/dL (ref 30.0–36.0)
MCV: 91.8 fL (ref 80.0–100.0)
Monocytes Absolute: 0.5 10*3/uL (ref 0.1–1.0)
Monocytes Relative: 9 %
Neutro Abs: 3.4 10*3/uL (ref 1.7–7.7)
Neutrophils Relative %: 64 %
Platelets: 225 10*3/uL (ref 150–400)
RBC: 4.53 MIL/uL (ref 3.87–5.11)
RDW: 13.3 % (ref 11.5–15.5)
WBC: 5.2 10*3/uL (ref 4.0–10.5)
nRBC: 0 % (ref 0.0–0.2)

## 2019-04-30 LAB — COMPREHENSIVE METABOLIC PANEL
ALT: 16 U/L (ref 0–44)
AST: 21 U/L (ref 15–41)
Albumin: 3.9 g/dL (ref 3.5–5.0)
Alkaline Phosphatase: 88 U/L (ref 38–126)
Anion gap: 10 (ref 5–15)
BUN: 13 mg/dL (ref 6–20)
CO2: 26 mmol/L (ref 22–32)
Calcium: 9.2 mg/dL (ref 8.9–10.3)
Chloride: 105 mmol/L (ref 98–111)
Creatinine, Ser: 0.71 mg/dL (ref 0.44–1.00)
GFR calc Af Amer: >60 mL/min (ref >60)
GFR calc non Af Amer: >60 mL/min (ref >60)
Glucose, Bld: 109 mg/dL — ABNORMAL HIGH (ref 70–99)
Potassium: 3.9 mmol/L (ref 3.5–5.1)
Sodium: 141 mmol/L (ref 135–145)
Total Bilirubin: 0.6 mg/dL (ref 0.3–1.2)
Total Protein: 6.8 g/dL (ref 6.5–8.1)

## 2019-04-30 NOTE — Progress Notes (Signed)
Kristen Kaiser, Kristen Kaiser   CLINIC:  Medical Oncology/Hematology  PCP:  Caryl Bis, MD [address] [place] [address] [phone]   REASON FOR VISIT: Follow-up for Ductal carcinoma in situ (DCIS) of left breast  CURRENT THERAPY: Tamoxifen    INTERVAL HISTORY:  Kristen Kaiser 54 y.o. female seen for follow-up of left breast DCIS.  She had recently a mammogram and DEXA scan.  Appetite is intact percent.  Energy levels are 75%.  Continues to have hot flashes.  Denies any new onset pains.  Sleep problems were reported.  Numbness in the hands and feet is also stable.    REVIEW OF SYSTEMS:  Review of Systems  Endocrine: Positive for hot flashes.  Neurological: Positive for numbness.  Psychiatric/Behavioral: Positive for sleep disturbance.  All other systems reviewed and are negative.    PAST MEDICAL/SURGICAL HISTORY:  Past Medical History:  Diagnosis Date  . Breast cancer ([place]) 2016  . Personal history of radiation therapy 2016   Past Surgical History:  Procedure Laterality Date  . BREAST LUMPECTOMY Left 2016     SOCIAL HISTORY:  Social History   Socioeconomic History  . Marital status: Married    Spouse name: Not on file  . Number of children: Not on file  . Years of education: Not on file  . Highest education level: Not on file  Occupational History  . Not on file  Tobacco Use  . Smoking status: Former Smoker    Packs/day: 1.00    Years: 15.00    Pack years: 15.00    Types: Cigarettes    Quit date: 12/18/1998    Years since quitting: 20.3  . Smokeless tobacco: Never Used  Substance and Sexual Activity  . Alcohol use: Not on file  . Drug use: Not on file  . Sexual activity: Not on file  Other Topics Concern  . Not on file  Social History Narrative  . Not on file   Social Determinants of Health   Financial Resource Strain:   . Difficulty of Paying Living Expenses: Not on file  Food Insecurity:   . Worried  About Charity fundraiser in the Last Year: Not on file  . Ran Out of Food in the Last Year: Not on file  Transportation Needs:   . Lack of Transportation (Medical): Not on file  . Lack of Transportation (Non-Medical): Not on file  Physical Activity:   . Days of Exercise per Week: Not on file  . Minutes of Exercise per Session: Not on file  Stress:   . Feeling of Stress : Not on file  Social Connections:   . Frequency of Communication with Friends and Family: Not on file  . Frequency of Social Gatherings with Friends and Family: Not on file  . Attends Religious Services: Not on file  . Active Member of Clubs or Organizations: Not on file  . Attends Archivist Meetings: Not on file  . Marital Status: Not on file  Intimate Partner Violence:   . Fear of Current or Ex-Partner: Not on file  . Emotionally Abused: Not on file  . Physically Abused: Not on file  . Sexually Abused: Not on file    FAMILY HISTORY:  History reviewed. No pertinent family history.  CURRENT MEDICATIONS:  Outpatient Encounter Medications as of 04/30/2019  Medication Sig  . citalopram (CELEXA) 40 MG tablet Take 1 tablet (40 mg total) by mouth daily.  Marland Kitchen  gabapentin (NEURONTIN) 300 MG capsule TAKE 1 CAPSULE BY MOUTH EVERYDAY AT BEDTIME  . tamoxifen (NOLVADEX) 20 MG tablet TAKE 1 TABLET BY MOUTH EVERY DAY   No facility-administered encounter medications on file as of 04/30/2019.    ALLERGIES:  Not on File   PHYSICAL EXAM:  ECOG Performance status: 1  Vitals:   04/30/19 1138  BP: (!) 149/96  Pulse: 77  Resp: 18  Temp: (!) 97.1 F (36.2 C)  SpO2: 98%   Filed Weights   04/30/19 1138  Weight: 184 lb 9.6 oz (83.7 kg)    Physical Exam Constitutional:      Appearance: She is well-developed.  Cardiovascular:     Rate and Rhythm: Normal rate and regular rhythm.     Heart sounds: Normal heart sounds.  Pulmonary:     Effort: Pulmonary effort is normal.     Breath sounds: Normal breath sounds.   Abdominal:     General: There is no distension.     Palpations: Abdomen is soft. There is no mass.  Musculoskeletal:        General: Normal range of motion.  Skin:    General: Skin is warm.  Neurological:     Mental Status: She is alert and oriented to person, place, and time.  Psychiatric:        Behavior: Behavior normal.        Thought Content: Thought content normal.        Judgment: Judgment normal.   Breast exam: No palpable masses in bilateral breast.  Left breast lumpectomy scar in the upper outer quadrant is unchanged.  No palpable adenopathy.   LABORATORY DATA:  I have reviewed the labs as listed.  CBC    Component Value Date/Time   WBC 5.2 04/30/2019 1017   RBC 4.53 04/30/2019 1017   HGB 14.1 04/30/2019 1017   HCT 41.6 04/30/2019 1017   PLT 225 04/30/2019 1017   MCV 91.8 04/30/2019 1017   MCH 31.1 04/30/2019 1017   MCHC 33.9 04/30/2019 1017   RDW 13.3 04/30/2019 1017   LYMPHSABS 1.2 04/30/2019 1017   MONOABS 0.5 04/30/2019 1017   EOSABS 0.1 04/30/2019 1017   BASOSABS 0.1 04/30/2019 1017   CMP Latest Ref Rng & Units 04/30/2019 09/11/2018 02/21/2018  Glucose 70 - 99 mg/dL 109(H) 123(H) 97  BUN 6 - 20 mg/dL 13 13 11   Creatinine 0.44 - 1.00 mg/dL 0.71 0.71 0.73  Sodium 135 - 145 mmol/L 141 139 140  Potassium 3.5 - 5.1 mmol/L 3.9 4.1 4.6  Chloride 98 - 111 mmol/L 105 105 108  CO2 22 - 32 mmol/L 26 25 27   Calcium 8.9 - 10.3 mg/dL 9.2 8.9 9.0  Total Protein 6.5 - 8.1 g/dL 6.8 6.8 7.1  Total Bilirubin 0.3 - 1.2 mg/dL 0.6 0.6 0.6  Alkaline Phos 38 - 126 U/L 88 92 91  AST 15 - 41 U/L 21 19 22   ALT 0 - 44 U/L 16 16 18        DIAGNOSTIC IMAGING:  I have independently reviewed the scans and discussed with the patient.     ASSESSMENT & PLAN:   Ductal carcinoma in situ (DCIS) of left breast 1.  High-grade DCIS of the left breast: -Status post lumpectomy on 01/27/2015 with pathology showing DCIS, high-grade with calcifications. -Status post radiation therapy and  tamoxifen started in March 2017. -She is continuing to tolerate tamoxifen with some hot flashes. -We reviewed mammogram dated 04/24/2019 which was BI-RADS Category 2. -Physical examination did  not reveal any changes in the lumpectomy scar.  We reviewed her labs which are within normal limits. -She will come back in 6 months for follow-up with repeat labs.  2.  Hot flashes: -She is continuing Celexa 20 mg daily and gabapentin 300 mg at bedtime. -She has on and off hot flashes.  3.  Osteopenia: -DEXA scan on 04/24/2019 reviewed by me shows T score of -1.2. -She will continue vitamin D supplements.  She will continue weightbearing exercises.  We will plan to repeat DEXA scan in 2 to 3 years.  4.  Hypertension: -Blood pressure today is 149/96. -She was told to follow-up with her primary doctor.     Orders placed this encounter:  No orders of the defined types were placed in this encounter.     Derek Jack, MD [place] [phone]

## 2019-04-30 NOTE — Patient Instructions (Addendum)
[place] at [facility] Discharge Instructions  You were seen today by Dr. Delton Coombes. He went over your recent test results. He will see you back in 6 months for labs and follow up.   Thank you for choosing [place] at [facility] to provide your oncology and hematology care.  To afford each patient quality time with our provider, please arrive at least 15 minutes before your scheduled appointment time.   If you have a lab appointment with the [place] please come in thru the  Main Entrance and check in at the main information desk  You need to re-schedule your appointment should you arrive 10 or more minutes late.  We strive to give you quality time with our providers, and arriving late affects you and other patients whose appointments are after yours.  Also, if you no show three or more times for appointments you may be dismissed from the clinic at the providers discretion.     Again, thank you for choosing [facility].  Our hope is that these requests will decrease the amount of time that you wait before being seen by our physicians.       _____________________________________________________________  Should you have questions after your visit to [facility], please contact our office at [phone] between the hours of 8:00 a.m. and 4:30 p.m.  Voicemails left after 4:00 p.m. will not be returned until the following business day.  For prescription refill requests, have your pharmacy contact our office and allow 72 hours.    Cancer Center Support Programs:   > Cancer Support Group  2nd Tuesday of the month 1pm-2pm, Journey Room

## 2019-05-05 NOTE — Assessment & Plan Note (Signed)
1.  High-grade DCIS of the left breast: -Status post lumpectomy on 01/27/2015 with pathology showing DCIS, high-grade with calcifications. -Status post radiation therapy and tamoxifen started in March 2017. -She is continuing to tolerate tamoxifen with some hot flashes. -We reviewed mammogram dated 04/24/2019 which was BI-RADS Category 2. -Physical examination did not reveal any changes in the lumpectomy scar.  We reviewed her labs which are within normal limits. -She will come back in 6 months for follow-up with repeat labs.  2.  Hot flashes: -She is continuing Celexa 20 mg daily and gabapentin 300 mg at bedtime. -She has on and off hot flashes.  3.  Osteopenia: -DEXA scan on 04/24/2019 reviewed by me shows T score of -1.2. -She will continue vitamin D supplements.  She will continue weightbearing exercises.  We will plan to repeat DEXA scan in 2 to 3 years.  4.  Hypertension: -Blood pressure today is 149/96. -She was told to follow-up with her primary doctor.

## 2019-07-12 ENCOUNTER — Telehealth: Payer: Self-pay | Admitting: Obstetrics & Gynecology

## 2019-07-12 NOTE — Telephone Encounter (Signed)

## 2019-07-16 ENCOUNTER — Ambulatory Visit (INDEPENDENT_AMBULATORY_CARE_PROVIDER_SITE_OTHER): Payer: 59 | Admitting: Obstetrics & Gynecology

## 2019-07-16 ENCOUNTER — Other Ambulatory Visit: Payer: Self-pay

## 2019-07-16 ENCOUNTER — Encounter: Payer: Self-pay | Admitting: Obstetrics & Gynecology

## 2019-07-16 ENCOUNTER — Other Ambulatory Visit (HOSPITAL_COMMUNITY)
Admission: RE | Admit: 2019-07-16 | Discharge: 2019-07-16 | Disposition: A | Discharge status: Home / Self Care | Payer: 59 | Source: Ambulatory Visit | Attending: Obstetrics & Gynecology | Admitting: Obstetrics & Gynecology

## 2019-07-16 VITALS — BP 141/93 | HR 85 | Ht 61.75 in | Wt 182.0 lb

## 2019-07-16 DIAGNOSIS — Z1211 Encounter for screening for malignant neoplasm of colon: Secondary | ICD-10-CM | POA: Diagnosis not present

## 2019-07-16 DIAGNOSIS — Z1212 Encounter for screening for malignant neoplasm of rectum: Secondary | ICD-10-CM

## 2019-07-16 DIAGNOSIS — Z853 Personal history of malignant neoplasm of breast: Secondary | ICD-10-CM | POA: Diagnosis not present

## 2019-07-16 DIAGNOSIS — Z01419 Encounter for gynecological examination (general) (routine) without abnormal findings: Secondary | ICD-10-CM | POA: Diagnosis present

## 2019-07-16 NOTE — Progress Notes (Signed)
Subjective:     Kristen Kaiser is a 54 y.o. female here for a routine exam.  No LMP recorded. Patient is postmenopausal. [id number] Birth Control Method:  BTL Menstrual Calendar(currently): amenorrheic  Current complaints: none.   Current acute medical issues:  none   Recent Gynecologic History No LMP recorded. Patient is postmenopausal. Last Pap: 2019,  normal Last mammogram: 04/24/19,  normal  Past Medical History:  Diagnosis Date  . Breast cancer ([place]) 2016  . COVID-19   . Hypertension   . Personal history of radiation therapy 2016    Past Surgical History:  Procedure Laterality Date  . BREAST LUMPECTOMY Left 2016  . CESAREAN SECTION      OB History    Gravida  2   Para  2   Term  2   Preterm      AB      Living  2     SAB      TAB      Ectopic      Multiple      Live Births  2           Social History   Socioeconomic History  . Marital status: Married    Spouse name: Not on file  . Number of children: Not on file  . Years of education: Not on file  . Highest education level: Not on file  Occupational History  . Not on file  Tobacco Use  . Smoking status: Former Smoker    Packs/day: 1.00    Years: 15.00    Pack years: 15.00    Types: Cigarettes    Quit date: 12/18/1998    Years since quitting: 20.5  . Smokeless tobacco: Never Used  Substance and Sexual Activity  . Alcohol use: Yes    Comment: occ  . Drug use: Never  . Sexual activity: Yes    Birth control/protection: Post-menopausal  Other Topics Concern  . Not on file  Social History Narrative  . Not on file   Social Determinants of Health   Financial Resource Strain: Low Risk   . Difficulty of Paying Living Expenses: Not hard at all  Food Insecurity: No Food Insecurity  . Worried About Charity fundraiser in the Last Year: Never true  . Ran Out of Food in the Last Year: Never true  Transportation Needs: No Transportation Needs  . Lack of Transportation (Medical): No  . Lack  of Transportation (Non-Medical): No  Physical Activity: Inactive  . Days of Exercise per Week: 0 days  . Minutes of Exercise per Session: 0 min  Stress: No Stress Concern Present  . Feeling of Stress : Only a little  Social Connections: Slightly Isolated  . Frequency of Communication with Friends and Family: More than three times a week  . Frequency of Social Gatherings with Friends and Family: Once a week  . Attends Religious Services: More than 4 times per year  . Active Member of Clubs or Organizations: No  . Attends Archivist Meetings: Never  . Marital Status: Married    Family History  Problem Relation Age of Onset  . Cancer Paternal Grandfather   . Alzheimer's disease Paternal Grandmother   . Congestive Heart Failure Maternal Grandmother   . Cancer Maternal Grandfather        rectal  . Atrial fibrillation Father   . Atrial fibrillation Mother   . Hypertension Brother   . Hypertension Sister      Current  Outpatient Medications:  .  citalopram (CELEXA) 40 MG tablet, Take 1 tablet (40 mg total) by mouth daily., Disp: 30 tablet, Rfl: 2 .  gabapentin (NEURONTIN) 300 MG capsule, TAKE 1 CAPSULE BY MOUTH EVERYDAY AT BEDTIME, Disp: 30 capsule, Rfl: 5 .  hydrochlorothiazide (HYDRODIURIL) 12.5 MG tablet, Take 12.5 mg by mouth daily., Disp: , Rfl:  .  tamoxifen (NOLVADEX) 20 MG tablet, TAKE 1 TABLET BY MOUTH EVERY DAY, Disp: 30 tablet, Rfl: 6  Review of Systems  Review of Systems  Constitutional: Negative for fever, chills, weight loss, malaise/fatigue and diaphoresis.  HENT: Negative for hearing loss, ear pain, nosebleeds, congestion, sore throat, neck pain, tinnitus and ear discharge.   Eyes: Negative for blurred vision, double vision, photophobia, pain, discharge and redness.  Respiratory: Negative for cough, hemoptysis, sputum production, shortness of breath, wheezing and stridor.   Cardiovascular: Negative for chest pain, palpitations, orthopnea, claudication, leg  swelling and PND.  Gastrointestinal: negative for abdominal pain. Negative for heartburn, nausea, vomiting, diarrhea, constipation, blood in stool and melena.  Genitourinary: Negative for dysuria, urgency, frequency, hematuria and flank pain.  Musculoskeletal: Negative for myalgias, back pain, joint pain and falls.  Skin: Negative for itching and rash.  Neurological: Negative for dizziness, tingling, tremors, sensory change, speech change, focal weakness, seizures, loss of consciousness, weakness and headaches.  Endo/Heme/Allergies: Negative for environmental allergies and polydipsia. Does not bruise/bleed easily.  Psychiatric/Behavioral: Negative for depression, suicidal ideas, hallucinations, memory loss and substance abuse. The patient is not nervous/anxious and does not have insomnia.        Objective:  Blood pressure (!) 141/93, pulse 85, height 5' 1.75" (1.568 m), weight 182 lb (82.6 kg).   Physical Exam  Vitals reviewed. Constitutional: She is oriented to person, place, and time. She appears well-developed and well-nourished.  HENT:  Head: Normocephalic and atraumatic.        Right Ear: External ear normal.  Left Ear: External ear normal.  Nose: Nose normal.  Mouth/Throat: Oropharynx is clear and moist.  Eyes: Conjunctivae and EOM are normal. Pupils are equal, round, and reactive to light. Right eye exhibits no discharge. Left eye exhibits no discharge. No scleral icterus.  Neck: Normal range of motion. Neck supple. No tracheal deviation present. No thyromegaly present.  Cardiovascular: Normal rate, regular rhythm, normal heart sounds and intact distal pulses.  Exam reveals no gallop and no friction rub.   No murmur heard. Respiratory: Effort normal and breath sounds normal. No respiratory distress. She has no wheezes. She has no rales. She exhibits no tenderness.  GI: Soft. Bowel sounds are normal. She exhibits no distension and no mass. There is no tenderness. There is no rebound  and no guarding.  Genitourinary:  Breasts no masses skin changes or nipple changes bilaterally      Vulva is normal without lesions Vagina is pink moist without discharge Cervix normal in appearance and pap is done Uterus is normal size shape and contour Adnexa is negative with normal sized ovaries  {Rectal    hemoccult negative, normal tone, no masses  Musculoskeletal: Normal range of motion. She exhibits no edema and no tenderness.  Neurological: She is alert and oriented to person, place, and time. She has normal reflexes. She displays normal reflexes. No cranial nerve deficit. She exhibits normal muscle tone. Coordination normal.  Skin: Skin is warm and dry. No rash noted. No erythema. No pallor.  Psychiatric: She has a normal mood and affect. Her behavior is normal. Judgment and thought content normal.  Medications Ordered at today's visit: No orders of the defined types were placed in this encounter.   Other orders placed at today's visit: No orders of the defined types were placed in this encounter.     Assessment:    Normal Gyn exam.   Hx of breast cancer Plan:  Menopausal  Follow up in: 3 years.   or prn   Return in about 3 years (around 07/16/2022) for yearly.

## 2019-07-17 LAB — HEMOCCULT GUIAC POC 1CARD (OFFICE): Fecal Occult Blood, POC: NEGATIVE

## 2019-07-17 NOTE — Addendum Note (Signed)
Addended by: Linton Rump on: 07/17/2019 08:44 AM   Modules accepted: Orders

## 2019-07-19 LAB — CYTOLOGY - PAP
Comment: NEGATIVE
Diagnosis: NEGATIVE
High risk HPV: NEGATIVE

## 2019-08-08 ENCOUNTER — Other Ambulatory Visit (HOSPITAL_COMMUNITY): Payer: Self-pay | Admitting: Nurse Practitioner

## 2019-10-11 ENCOUNTER — Other Ambulatory Visit (HOSPITAL_COMMUNITY): Payer: Self-pay | Admitting: *Deleted

## 2019-10-11 ENCOUNTER — Other Ambulatory Visit (HOSPITAL_COMMUNITY): Payer: Self-pay | Admitting: Nurse Practitioner

## 2019-10-11 MED ORDER — GABAPENTIN 300 MG PO CAPS: ORAL_CAPSULE | ORAL | 5 refills | Status: DC

## 2019-11-05 ENCOUNTER — Inpatient Hospital Stay (HOSPITAL_COMMUNITY): Payer: 59

## 2019-11-12 ENCOUNTER — Ambulatory Visit (HOSPITAL_COMMUNITY): Payer: 59 | Admitting: Hematology

## 2019-11-20 ENCOUNTER — Other Ambulatory Visit: Payer: Self-pay

## 2019-11-20 ENCOUNTER — Inpatient Hospital Stay (HOSPITAL_COMMUNITY): Payer: 59 | Attending: Hematology

## 2019-11-20 ENCOUNTER — Inpatient Hospital Stay (HOSPITAL_BASED_OUTPATIENT_CLINIC_OR_DEPARTMENT_OTHER): Payer: 59 | Admitting: Nurse Practitioner

## 2019-11-20 VITALS — BP 135/90 | HR 83 | Temp 98.2°F | Resp 16 | Wt 183.0 lb

## 2019-11-20 DIAGNOSIS — Z923 Personal history of irradiation: Secondary | ICD-10-CM | POA: Insufficient documentation

## 2019-11-20 DIAGNOSIS — M8580 Other specified disorders of bone density and structure, unspecified site: Secondary | ICD-10-CM | POA: Diagnosis not present

## 2019-11-20 DIAGNOSIS — Z79899 Other long term (current) drug therapy: Secondary | ICD-10-CM | POA: Diagnosis not present

## 2019-11-20 DIAGNOSIS — Z87891 Personal history of nicotine dependence: Secondary | ICD-10-CM | POA: Diagnosis not present

## 2019-11-20 DIAGNOSIS — I10 Essential (primary) hypertension: Secondary | ICD-10-CM | POA: Insufficient documentation

## 2019-11-20 DIAGNOSIS — D0512 Intraductal carcinoma in situ of left breast: Secondary | ICD-10-CM

## 2019-11-20 DIAGNOSIS — Z1231 Encounter for screening mammogram for malignant neoplasm of breast: Secondary | ICD-10-CM

## 2019-11-20 DIAGNOSIS — Z79810 Long term (current) use of selective estrogen receptor modulators (SERMs): Secondary | ICD-10-CM | POA: Insufficient documentation

## 2019-11-20 DIAGNOSIS — R232 Flushing: Secondary | ICD-10-CM | POA: Diagnosis not present

## 2019-11-20 LAB — COMPREHENSIVE METABOLIC PANEL
ALT: 16 U/L (ref 0–44)
AST: 22 U/L (ref 15–41)
Albumin: 3.8 g/dL (ref 3.5–5.0)
Alkaline Phosphatase: 79 U/L (ref 38–126)
Anion gap: 10 (ref 5–15)
BUN: 12 mg/dL (ref 6–20)
CO2: 25 mmol/L (ref 22–32)
Calcium: 9 mg/dL (ref 8.9–10.3)
Chloride: 103 mmol/L (ref 98–111)
Creatinine, Ser: 0.71 mg/dL (ref 0.44–1.00)
GFR calc Af Amer: >60 mL/min (ref >60)
GFR calc non Af Amer: >60 mL/min (ref >60)
Glucose, Bld: 102 mg/dL — ABNORMAL HIGH (ref 70–99)
Potassium: 4 mmol/L (ref 3.5–5.1)
Sodium: 138 mmol/L (ref 135–145)
Total Bilirubin: 0.6 mg/dL (ref 0.3–1.2)
Total Protein: 6.9 g/dL (ref 6.5–8.1)

## 2019-11-20 LAB — CBC WITH DIFFERENTIAL/PLATELET
Abs Immature Granulocytes: 0.02 10*3/uL (ref 0.00–0.07)
Basophils Absolute: 0 10*3/uL (ref 0.0–0.1)
Basophils Relative: 1 %
Eosinophils Absolute: 0.1 10*3/uL (ref 0.0–0.5)
Eosinophils Relative: 2 %
HCT: 43.3 % (ref 36.0–46.0)
Hemoglobin: 14 g/dL (ref 12.0–15.0)
Immature Granulocytes: 0 %
Lymphocytes Relative: 24 %
Lymphs Abs: 1.2 10*3/uL (ref 0.7–4.0)
MCH: 29.9 pg (ref 26.0–34.0)
MCHC: 32.3 g/dL (ref 30.0–36.0)
MCV: 92.3 fL (ref 80.0–100.0)
Monocytes Absolute: 0.5 10*3/uL (ref 0.1–1.0)
Monocytes Relative: 9 %
Neutro Abs: 3.2 10*3/uL (ref 1.7–7.7)
Neutrophils Relative %: 64 %
Platelets: 233 10*3/uL (ref 150–400)
RBC: 4.69 MIL/uL (ref 3.87–5.11)
RDW: 12.9 % (ref 11.5–15.5)
WBC: 5 10*3/uL (ref 4.0–10.5)
nRBC: 0 % (ref 0.0–0.2)

## 2019-11-20 NOTE — Assessment & Plan Note (Addendum)
1.  High-grade DCIS of left breast: -Status post lumpectomy on 01/27/2015 with pathology showing DCIS, high-grade with calcifications. -Status post radiation therapy and tamoxifen started in March 2017. -She is continuing to tolerate tamoxifen with some hot flashes. -Last mammogram on 04/24/2019 was BI-RADS Category 2 benign. -Physical examination not reveal any changes in the lumpectomy scar. -Labs done on 11/20/2019 were WNL. -She will have completed 5 years of tamoxifen in March 2022 we will talk about discontinuing it at this point. -She will come back in 6 months with repeat labs and mammogram.  We will move her to yearly visits at this time.  2.  Osteopenia: -DEXA scan on 05/10/2019 showed T score of -1.2. -She will continue taking calcium and vitamin D daily.  3.  Hot flashes: -She is taking gabapentin 300 mg at bedtime.  Unsure if it is actually helping. -She is still having hot flashes.

## 2019-11-20 NOTE — Progress Notes (Signed)
[facility] [place], [place] [address]   CLINIC:  Medical Oncology/Hematology  PCP:  Caryl Bis, MD [place] [phone]   REASON FOR VISIT: Follow-up for breast cancer   CURRENT THERAPY: Tamoxifen   INTERVAL HISTORY:  Ms. Cornman 54 y.o. female returns for routine follow-up for breast cancer.  Patient reports she is doing well since her last visit.  She denies any lumps or bumps present.  She denies any new bone pain. Denies any nausea, vomiting, or diarrhea. Denies any new pains. Had not noticed any recent bleeding such as epistaxis, hematuria or hematochezia. Denies recent chest pain on exertion, shortness of breath on minimal exertion, pre-syncopal episodes, or palpitations. Denies any numbness or tingling in hands or feet. Denies any recent fevers, infections, or recent hospitalizations. Patient reports appetite at 100% and energy level at 75%.  She is eating well maintaining her weight is time.     REVIEW OF SYSTEMS:  Review of Systems  All other systems reviewed and are negative.    PAST MEDICAL/SURGICAL HISTORY:  Past Medical History:  Diagnosis Date   Breast cancer ([place]) 2016   COVID-19    Hypertension    Personal history of radiation therapy 2016   Past Surgical History:  Procedure Laterality Date   BREAST LUMPECTOMY Left 2016   CESAREAN SECTION       SOCIAL HISTORY:  Social History   Socioeconomic History   Marital status: Married    Spouse name: Not on file   Number of children: Not on file   Years of education: Not on file   Highest education level: Not on file  Occupational History   Not on file  Tobacco Use   Smoking status: Former Smoker    Packs/day: 1.00    Years: 15.00    Pack years: 15.00    Types: Cigarettes    Quit date: 12/18/1998    Years since quitting: 20.9   Smokeless tobacco: Never Used  Vaping Use   Vaping Use: Never used  Substance and Sexual Activity    Alcohol use: Yes    Comment: occ   Drug use: Never   Sexual activity: Yes    Birth control/protection: Post-menopausal  Other Topics Concern   Not on file  Social History Narrative   Not on file   Social Determinants of Health   Financial Resource Strain: Low Risk    Difficulty of Paying Living Expenses: Not hard at all  Food Insecurity: No Food Insecurity   Worried About Charity fundraiser in the Last Year: Never true   [place] in the Last Year: Never true  Transportation Needs: No Transportation Needs   Lack of Transportation (Medical): No   Lack of Transportation (Non-Medical): No  Physical Activity: Inactive   Days of Exercise per Week: 0 days   Minutes of Exercise per Session: 0 min  Stress: No Stress Concern Present   Feeling of Stress : Only a little  Social Connections: Moderately Integrated   Frequency of Communication with Friends and Family: More than three times a week   Frequency of Social Gatherings with Friends and Family: Once a week   Attends Religious Services: More than 4 times per year   Active Member of [employer] or Organizations: No   Attends Archivist Meetings: Never   Marital Status: Married  Human resources officer Violence: Not At Risk   Fear of Current or Ex-Partner:  No   Emotionally Abused: No   Physically Abused: No   Sexually Abused: No    FAMILY HISTORY:  Family History  Problem Relation Age of Onset   Cancer Paternal Grandfather    Alzheimer's disease Paternal Grandmother    Congestive Heart Failure Maternal Grandmother    Cancer Maternal Grandfather        rectal   Atrial fibrillation Father    Atrial fibrillation Mother    Hypertension Brother    Hypertension Sister     CURRENT MEDICATIONS:  Outpatient Encounter Medications as of 11/20/2019  Medication Sig   citalopram (CELEXA) 40 MG tablet Take 1 tablet (40 mg total) by mouth daily.   gabapentin (NEURONTIN) 300 MG capsule TAKE 1 CAPSULE  BY MOUTH EVERYDAY AT BEDTIME   gabapentin (NEURONTIN) 300 MG capsule TAKE 1 CAPSULE BY MOUTH EVERYDAY AT BEDTIME   losartan-hydrochlorothiazide (HYZAAR) 50-12.5 MG tablet Take 1 tablet by mouth daily.   tamoxifen (NOLVADEX) 20 MG tablet TAKE 1 TABLET BY MOUTH EVERY DAY   [DISCONTINUED] hydrochlorothiazide (HYDRODIURIL) 12.5 MG tablet Take 12.5 mg by mouth daily.   No facility-administered encounter medications on file as of 11/20/2019.    ALLERGIES:  No Known Allergies   PHYSICAL EXAM:  ECOG Performance status: 1  Vitals:   11/20/19 1016  BP: 135/90  Pulse: 83  Resp: 16  Temp: 98.2 F (36.8 C)  SpO2: 97%   Filed Weights   11/20/19 1016  Weight: 182 lb 15.7 oz (83 kg)   Physical Exam Constitutional:      Appearance: Normal appearance. She is normal weight.  Cardiovascular:     Rate and Rhythm: Normal rate and regular rhythm.     Heart sounds: Normal heart sounds.  Pulmonary:     Effort: Pulmonary effort is normal.     Breath sounds: Normal breath sounds.  Abdominal:     General: Bowel sounds are normal.     Palpations: Abdomen is soft.  Musculoskeletal:        General: Normal range of motion.  Skin:    General: Skin is warm.  Neurological:     Mental Status: She is alert and oriented to person, place, and time. Mental status is at baseline.  Psychiatric:        Mood and Affect: Mood normal.        Behavior: Behavior normal.        Thought Content: Thought content normal.        Judgment: Judgment normal.      LABORATORY DATA:  I have reviewed the labs as listed.  CBC    Component Value Date/Time   WBC 5.0 11/20/2019 1000   RBC 4.69 11/20/2019 1000   HGB 14.0 11/20/2019 1000   HCT 43.3 11/20/2019 1000   PLT 233 11/20/2019 1000   MCV 92.3 11/20/2019 1000   MCH 29.9 11/20/2019 1000   MCHC 32.3 11/20/2019 1000   RDW 12.9 11/20/2019 1000   LYMPHSABS 1.2 11/20/2019 1000   MONOABS 0.5 11/20/2019 1000   EOSABS 0.1 11/20/2019 1000   BASOSABS 0.0  11/20/2019 1000   CMP Latest Ref Rng & Units 11/20/2019 04/30/2019 09/11/2018  Glucose 70 - 99 mg/dL 102(H) 109(H) 123(H)  BUN 6 - 20 mg/dL 12 13 13   Creatinine 0.44 - 1.00 mg/dL 0.71 0.71 0.71  Sodium 135 - 145 mmol/L 138 141 139  Potassium 3.5 - 5.1 mmol/L 4.0 3.9 4.1  Chloride 98 - 111 mmol/L 103 105 105  CO2 22 - 32  mmol/L 25 26 25   Calcium 8.9 - 10.3 mg/dL 9.0 9.2 8.9  Total Protein 6.5 - 8.1 g/dL 6.9 6.8 6.8  Total Bilirubin 0.3 - 1.2 mg/dL 0.6 0.6 0.6  Alkaline Phos 38 - 126 U/L 79 88 92  AST 15 - 41 U/L 22 21 19   ALT 0 - 44 U/L 16 16 16     All questions were answered to patient's stated satisfaction. Encouraged patient to call with any new concerns or questions before his next visit to the cancer center and we can certain see him sooner, if needed.     ASSESSMENT & PLAN:  Ductal carcinoma in situ (DCIS) of left breast 1.  High-grade DCIS of left breast: -Status post lumpectomy on 01/27/2015 with pathology showing DCIS, high-grade with calcifications. -Status post radiation therapy and tamoxifen started in March 2017. -She is continuing to tolerate tamoxifen with some hot flashes. -Last mammogram on 04/24/2019 was BI-RADS Category 2 benign. -Physical examination not reveal any changes in the lumpectomy scar. -Labs done on 11/20/2019 were WNL. -She will have completed 5 years of tamoxifen in March 2022 we will talk about discontinuing it at this point. -She will come back in 6 months with repeat labs and mammogram.  We will move her to yearly visits at this time.  2.  Osteopenia: -DEXA scan on 05/10/2019 showed T score of -1.2. -She will continue taking calcium and vitamin D daily.  3.  Hot flashes: -She is taking gabapentin 300 mg at bedtime.  Unsure if it is actually helping. -She is still having hot flashes.      Orders placed this encounter:  Orders Placed This Encounter  Procedures   MM 3D SCREEN BREAST BILATERAL   Lactate dehydrogenase   CBC with  Differential/Platelet   Comprehensive metabolic panel   Vitamin V36   VITAMIN D 25 Hydroxy (Vit-D Deficiency, Fractures)      [name], FNP-C [facility] [phone]

## 2019-12-10 ENCOUNTER — Other Ambulatory Visit (HOSPITAL_COMMUNITY): Payer: Self-pay | Admitting: Hematology

## 2019-12-10 DIAGNOSIS — D0512 Intraductal carcinoma in situ of left breast: Secondary | ICD-10-CM

## 2020-03-18 ENCOUNTER — Encounter: Payer: Self-pay | Admitting: Allergy

## 2020-03-18 ENCOUNTER — Telehealth: Payer: Self-pay

## 2020-03-18 ENCOUNTER — Ambulatory Visit (INDEPENDENT_AMBULATORY_CARE_PROVIDER_SITE_OTHER): Payer: 59 | Admitting: Allergy

## 2020-03-18 ENCOUNTER — Other Ambulatory Visit: Payer: Self-pay

## 2020-03-18 VITALS — BP 128/78 | HR 86 | Temp 97.2°F | Resp 18 | Ht 62.99 in | Wt 183.0 lb

## 2020-03-18 DIAGNOSIS — L299 Pruritus, unspecified: Secondary | ICD-10-CM | POA: Diagnosis not present

## 2020-03-18 DIAGNOSIS — T781XXD Other adverse food reactions, not elsewhere classified, subsequent encounter: Secondary | ICD-10-CM | POA: Diagnosis not present

## 2020-03-18 DIAGNOSIS — T783XXA Angioneurotic edema, initial encounter: Secondary | ICD-10-CM | POA: Insufficient documentation

## 2020-03-18 DIAGNOSIS — R21 Rash and other nonspecific skin eruption: Secondary | ICD-10-CM | POA: Diagnosis not present

## 2020-03-18 DIAGNOSIS — T783XXD Angioneurotic edema, subsequent encounter: Secondary | ICD-10-CM | POA: Diagnosis not present

## 2020-03-18 MED ORDER — CETIRIZINE HCL 10 MG PO TABS
10.0000 mg | ORAL_TABLET | Freq: Two times a day (BID) | ORAL | 5 refills | Status: DC
Start: 2020-03-18 — End: 2020-03-18

## 2020-03-18 MED ORDER — CETIRIZINE HCL 10 MG PO TABS: 10.0000 mg | ORAL_TABLET | Freq: Every day | ORAL | 5 refills | Status: DC

## 2020-03-18 MED ORDER — FAMOTIDINE 20 MG PO TABS: 20.0000 mg | ORAL_TABLET | Freq: Two times a day (BID) | ORAL | 5 refills | Status: AC

## 2020-03-18 NOTE — Assessment & Plan Note (Addendum)
02/04/2020 alpha gal 1.98. Skin testing negative to beef, pork and lamb.  Discussed with patient that bloodwork was minimally positive.   If above bloodwork comes back normal and symptoms improve with above medications, then may consider burger challenge in the office in the future. Meanwhile continue to avoid red meat.   For mild symptoms you can take over the counter antihistamines such as Benadryl and monitor symptoms closely. If symptoms worsen or if you have severe symptoms including breathing issues, throat closure, significant swelling, whole body hives, severe diarrhea and vomiting, lightheadedness then inject epinephrine and seek immediate medical care afterwards.

## 2020-03-18 NOTE — Telephone Encounter (Signed)
I'll send it in for once a day. Then she can supplement with over the counter zyrtec - she can buy the generic. Usually cheaper at [employer] or [employer] or even [employer].

## 2020-03-18 NOTE — Patient Instructions (Addendum)
Rash/swelling:   Today's testing was all negative - results given.   Start zyrtec (cetirizine) 10mg  twice a day.  If hives are not controlled or causes drowsiness let know.  Start pepcid (famotidine) 20mg  twice a day.  . Avoid the following potential triggers: alcohol, tight clothing, NSAIDs, hot showers and getting overheated.  . Continue to avoid red meat and lisinopril for now. [name] Keep track of episodes o Write down what you had come across during outbreaks. o You may try to eliminate dairy from your diet and see if your symptoms improve.  . Get bloodwork:  o We are ordering labs, so please allow 1-2 weeks for the results to come back. o With the newly implemented Cures Act, the labs might be visible to you at the same time that they become visible to me. However, I will not address the results until all of the results are back, so please be patient.   Follow up in 2 months or sooner if needed.

## 2020-03-18 NOTE — Telephone Encounter (Signed)
Spoke with patient, informed her of Dr. Elmyra Ricks recommendation. Patient verbalized understanding.

## 2020-03-18 NOTE — Assessment & Plan Note (Signed)
.   See assessment and plan as above. 

## 2020-03-18 NOTE — Assessment & Plan Note (Addendum)
Multiple episodes of facial angioedema starting 1 month ago. Lisinopril was stopped but still having minor episodes mainly at night with some associated pruritis and rash. Alpha gal was 1.98 on 02/04/20. Eliminated red meat but still having milder episodes. Concerned about allergies. H/o breast cancer on tamoxifen.  Today's skin testing was negative to environmental allergies and select foods.  Discussed with patient that I'm not sure what or if there's a trigger for these symptoms.   Start zyrtec (cetirizine) 10mg  twice a day.  If symptoms are not controlled or causes drowsiness let know.  Start Pepcid (famotidine) 20mg  twice a day.  . Avoid the following potential triggers: alcohol, tight clothing, NSAIDs, hot showers and getting overheated.  . Continue to avoid red meat and lisinopril for now. [name] Keep track of episodes o Write down what you had come across during outbreaks. o You may try to eliminate dairy from your diet and see if your symptoms improve. . Get bloodwork as below to rule out other etiologies.

## 2020-03-18 NOTE — Telephone Encounter (Signed)
Patient called stating her pharmacy informed her that her insurance will not cover the zyrtec at 2X/Day.  Patient is wondering what can be done for it to be covered?  Please Advise

## 2020-03-18 NOTE — Telephone Encounter (Signed)
Please advise 

## 2020-03-18 NOTE — Progress Notes (Signed)
New Patient Note  RE: Kristen Kaiser MRN: [id number] DOB: 10-20-1965 Date of Office Visit: 03/18/2020  Referring provider: Caryl Bis, MD Primary care provider: Caryl Bis, MD  Chief Complaint: Allergic Rhinitis  (Alpha Gal)  History of Present Illness: I had the pleasure of seeing Kristen Kaiser for initial evaluation at the [facility] on 03/18/2020. She is a 54 y.o. female, who is referred here by Caryl Bis, MD for the evaluation of angioedema.  Lip swelling, slightly pruritic and few areas of rash started about 1 month ago. She went to the ER and her lisinopril was stopped but still having issues but not as severe as the initial episode. This mainly occurs at night.   Patient went to see PCP and had steroid injection which helped.   Associated symptoms include: none. Suspected triggers are unknown - possibly red meat. Patient has been avoiding red meat since middle of November and still had about 3-4 episodes. Last episode was on Christmas day night.  Symptoms usually resolve within a few hours after taking Pepcid/benadryl.   Denies any fevers, chills, changes in medications, foods, personal care products or recent infections. She has tried the following therapies: benadryl, famotidine with good benefit. Systemic steroids yes. Currently on loratadine 10mg  daily.  Previous work up includes: 02/04/2020 alpha gal 1.98. No recent tick bites.  Previous history of rash/hives/angioedema: none. Patient is up to date with the following cancer screening tests: physical exam, mammogram, colonoscopy. Patient had breast cancer and taking tamoxifen for almost 5 years now.  Patient had COVID-19 in December 2020 - got 2 Moderna vaccines.  02/03/2020 ER visit: "Patient is a 54 year old female with onset of upper lip swelling this evening no swelling of her throat or tongue no respiratory problems noticed some itching earlier today by exam is awake alert cooperative.  Cardiorespiratory general and neuro exam negative abdomen benign small amount of left lower lip swelling. Medications have been reviewed she takes lisinopril. I have counseled her regarding ACE inhibitor angioedema and the need to stop this medication and its relatives permanently to avoid complications is verbalized understanding patient treatment and referral to primary care to discuss alternative blood pressure medications."  Assessment and Plan: Kristen Kaiser is a 54 y.o. female with: Angio-edema Multiple episodes of facial angioedema starting 1 month ago. Lisinopril was stopped but still having minor episodes mainly at night with some associated pruritis and rash. Alpha gal was 1.98 on 02/04/20. Eliminated red meat but still having milder episodes. Concerned about allergies. H/o breast cancer on tamoxifen.  Today's skin testing was negative to environmental allergies and select foods.  Discussed with patient that I'm not sure what or if there's a trigger for these symptoms.   Start zyrtec (cetirizine) 10mg  twice a day.  If symptoms are not controlled or causes drowsiness let us know.  Start Pepcid (famotidine) 20mg  twice a day.  . Avoid the following potential triggers: alcohol, tight clothing, NSAIDs, hot showers and getting overheated.  . Continue to avoid red meat and lisinopril for now. Marland Kitchen Keep track of episodes o Write down what you had come across during outbreaks. o You may try to eliminate dairy from your diet and see if your symptoms improve. . Get bloodwork as below to rule out other etiologies.   Rash and other nonspecific skin eruption  See assessment and plan as above.  Pruritus  See assessment and plan as above.  Adverse reaction to food, subsequent encounter 02/04/2020 alpha  gal 1.98. Skin testing negative to beef, pork and lamb.  Discussed with patient that bloodwork was minimally positive.   If above bloodwork comes back normal and symptoms improve with above  medications, then may consider burger challenge in the office in the future. Meanwhile continue to avoid red meat.   For mild symptoms you can take over the counter antihistamines such as Benadryl and monitor symptoms closely. If symptoms worsen or if you have severe symptoms including breathing issues, throat closure, significant swelling, whole body hives, severe diarrhea and vomiting, lightheadedness then inject epinephrine and seek immediate medical care afterwards.  Return in about 2 months (around 05/19/2020).  Meds ordered this encounter  Medications  . famotidine (PEPCID) 20 MG tablet    Sig: Take 1 tablet (20 mg total) by mouth 2 (two) times daily.    Dispense:  60 tablet    Refill:  5  . cetirizine (ZYRTEC ALLERGY) 10 MG tablet    Sig: Take 1 tablet (10 mg total) by mouth 2 (two) times daily.    Dispense:  60 tablet    Refill:  5    Lab Orders     CBC with Differential/Platelet     Comprehensive metabolic panel     Sedimentation rate     Tryptase     Thyroid Cascade Profile     C1 esterase inhibitor, functional     C1 Esterase Inhibitor     Complement component c1q     ANA w/Reflex     Chronic Urticaria     C-reactive protein  Other allergy screening: Asthma: no Rhino conjunctivitis: some mild rhinitis symptoms and takes OTC antihistamines and Flonase with good benefit Food allergy:  Dietary History: patient has been eating other foods including milk, eggs, peanut, treenuts, sesame, shellfish, fish, soy, wheat, meats, fruits and vegetables.  She reports reading labels and avoiding red meat in diet completely.   Medication allergy: no Hymenoptera allergy: no Urticaria: no Eczema:no History of recurrent infections suggestive of immunodeficency: no  Diagnostics: Skin Testing: Environmental allergy panel and select foods. Negative test to: environmental allergies and select foods - results given.  Results discussed with patient/family.  Airborne Adult Perc -  03/18/20 0917    Time Antigen Placed 9532    Allergen Manufacturer Waynette Buttery    Location Back    Number of Test 59    Panel 1 Select    1. Control-Buffer 50% Glycerol Negative    2. Control-Histamine 1 mg/ml 2+    3. Albumin saline Negative    4. Bahia Negative    5. French Southern Territories Negative    6. Johnson Negative    7. Kentucky Blue Negative    8. Meadow Fescue Negative    9. Perennial Rye Negative    10. Sweet Vernal Negative    11. Timothy Negative    12. Cocklebur Negative    13. Burweed Marshelder Negative    14. Ragweed, short Negative    15. Ragweed, Giant Negative    16. Plantain,  English Negative    17. Lamb's Quarters Negative    18. Sheep Sorrell Negative    19. Rough Pigweed Negative    20. Marsh Elder, Rough Negative    21. Mugwort, Common Negative    22. Ash mix Negative    23. Birch mix Negative    24. Beech American Negative    25. Box, Elder Negative    26. Cedar, red Negative    27. Cottonwood, Guinea-Bissau Negative  28. Elm mix Negative    29. Hickory Negative    30. Maple mix Negative    31. Oak, Guinea-Bissau mix Negative    32. Pecan Pollen Negative    33. Pine mix Negative    34. Sycamore Eastern Negative    35. Walnut, Black Pollen Negative    36. Alternaria alternata Negative    37. Cladosporium Herbarum Negative    38. Aspergillus mix Negative    39. Penicillium mix Negative    40. Bipolaris sorokiniana (Helminthosporium) Negative    41. Drechslera spicifera (Curvularia) Negative    42. Mucor plumbeus Negative    43. Fusarium moniliforme Negative    44. Aureobasidium pullulans (pullulara) Negative    45. Rhizopus oryzae Negative    46. Botrytis cinera Negative    47. Epicoccum nigrum Negative    48. Phoma betae Negative    49. Candida Albicans Negative    50. Trichophyton mentagrophytes Negative    51. Mite, D Farinae  5,000 AU/ml Negative    52. Mite, D Pteronyssinus  5,000 AU/ml Negative    53. Cat Hair 10,000 BAU/ml Negative    54.  Dog Epithelia  Negative    55. Mixed Feathers Negative    56. Horse Epithelia Negative    57. Cockroach, German Negative    58. Mouse Negative    59. Tobacco Leaf Negative    Other Omitted    Other Omitted          Food Adult Perc - 03/18/20 0900    Time Antigen Placed 0865    Allergen Manufacturer Waynette Buttery    Location Back    Number of allergen test 10    1. Peanut Negative    2. Soybean Negative    3. Wheat Negative    4. Sesame Negative    5. Milk, cow Negative    6. Egg White, Chicken Negative    7. Casein Negative    8. Shellfish Mix Negative    9. Fish Mix Negative    10. Cashew Negative    37. Pork Negative    40. Beef Negative    41. Lamb Negative           Past Medical History: Patient Active Problem List   Diagnosis Date Noted  . Angio-edema 03/18/2020  . Rash and other nonspecific skin eruption 03/18/2020  . Pruritus 03/18/2020  . Adverse reaction to food, subsequent encounter 03/18/2020  . Ductal carcinoma in situ (DCIS) of left breast 12/18/2015   Past Medical History:  Diagnosis Date  . Angio-edema   . Breast cancer (HCC) 2016  . COVID-19   . Eczema   . Hypertension   . Personal history of radiation therapy 2016  . Urticaria    Past Surgical History: Past Surgical History:  Procedure Laterality Date  . BREAST LUMPECTOMY Left 2016  . CESAREAN SECTION     Medication List:  Current Outpatient Medications  Medication Sig Dispense Refill  . cetirizine (ZYRTEC ALLERGY) 10 MG tablet Take 1 tablet (10 mg total) by mouth 2 (two) times daily. 60 tablet 5  . citalopram (CELEXA) 40 MG tablet TAKE 1 TABLET BY MOUTH EVERY DAY 30 tablet 2  . EPINEPHrine 0.3 mg/0.3 mL IJ SOAJ injection SMARTSIG:0.3 Milliliter(s) IM Once PRN    . famotidine (PEPCID) 20 MG tablet Take 1 tablet (20 mg total) by mouth 2 (two) times daily. 60 tablet 5  . gabapentin (NEURONTIN) 300 MG capsule TAKE 1 CAPSULE BY MOUTH EVERYDAY AT  BEDTIME 30 capsule 5  . hydrochlorothiazide (HYDRODIURIL) 12.5 MG  tablet Take 12.5 mg by mouth daily.    Marland Kitchen losartan-hydrochlorothiazide (HYZAAR) 50-12.5 MG tablet Take 1 tablet by mouth daily.    . tamoxifen (NOLVADEX) 20 MG tablet TAKE 1 TABLET BY MOUTH EVERY DAY 30 tablet 6   No current facility-administered medications for this visit.   Allergies: Allergies  Allergen Reactions  . Lisinopril Swelling   Social History: Social History   Socioeconomic History  . Marital status: Married    Spouse name: Not on file  . Number of children: Not on file  . Years of education: Not on file  . Highest education level: Not on file  Occupational History  . Not on file  Tobacco Use  . Smoking status: Former Smoker    Packs/day: 1.00    Years: 15.00    Pack years: 15.00    Types: Cigarettes    Quit date: 12/18/1998    Years since quitting: 21.2  . Smokeless tobacco: Never Used  Vaping Use  . Vaping Use: Never used  Substance and Sexual Activity  . Alcohol use: Yes    Comment: occ  . Drug use: Never  . Sexual activity: Yes    Birth control/protection: Post-menopausal  Other Topics Concern  . Not on file  Social History Narrative  . Not on file   Social Determinants of Health   Financial Resource Strain: Low Risk   . Difficulty of Paying Living Expenses: Not hard at all  Food Insecurity: No Food Insecurity  . Worried About Charity fundraiser in the Last Year: Never true  . Ran Out of Food in the Last Year: Never true  Transportation Needs: No Transportation Needs  . Lack of Transportation (Medical): No  . Lack of Transportation (Non-Medical): No  Physical Activity: Inactive  . Days of Exercise per Week: 0 days  . Minutes of Exercise per Session: 0 min  Stress: No Stress Concern Present  . Feeling of Stress : Only a little  Social Connections: Moderately Integrated  . Frequency of Communication with Friends and Family: More than three times a week  . Frequency of Social Gatherings with Friends and Family: Once a week  . Attends  Religious Services: More than 4 times per year  . Active Member of Clubs or Organizations: No  . Attends Archivist Meetings: Never  . Marital Status: Married   Lives in a 54 year old house. Smoking: denies Occupation: Production assistant, radio HistoryFreight forwarder in the house: no Charity fundraiser in the family room: yes Carpet in the bedroom: no Heating: gas Cooling: heat pump Pet: 2 dogs x 4-5 years  Family History: Family History  Problem Relation Age of Onset  . Cancer Paternal Grandfather   . Asthma Paternal Grandfather   . Alzheimer's disease Paternal Grandmother   . Congestive Heart Failure Maternal Grandmother   . Cancer Maternal Grandfather        rectal  . Atrial fibrillation Father   . Atrial fibrillation Mother   . Hypertension Brother   . Hypertension Sister   . Allergic rhinitis Neg Hx   . Eczema Neg Hx   . Urticaria Neg Hx    Problem                               Relation Asthma  No  Eczema                                no Food allergy                          no Allergic rhino conjunctivitis     No  Angioedema    No  Review of Systems  Constitutional: Negative for appetite change, chills, fever and unexpected weight change.  HENT: Negative for congestion and rhinorrhea.   Eyes: Negative for itching.  Respiratory: Negative for cough, chest tightness, shortness of breath and wheezing.   Cardiovascular: Negative for chest pain.  Gastrointestinal: Negative for abdominal pain.  Genitourinary: Negative for difficulty urinating.  Skin: Negative for rash.  Allergic/Immunologic: Negative for environmental allergies.  Neurological: Negative for headaches.   Objective: BP 128/78 (BP Location: Right Arm, Patient Position: Sitting, Cuff Size: Normal)   Pulse 86   Temp (!) 97.2 F (36.2 C) (Temporal)   Resp 18   Ht 5' 2.99" (1.6 m)   Wt 183 lb (83 kg)   SpO2 97%   BMI 32.43 kg/m  Body mass index is 32.43  kg/m. Physical Exam Vitals and nursing note reviewed.  Constitutional:      Appearance: Normal appearance. She is well-developed.  HENT:     Head: Normocephalic and atraumatic.     Right Ear: External ear normal.     Left Ear: External ear normal.     Nose: Nose normal.     Mouth/Throat:     Mouth: Mucous membranes are moist.     Pharynx: Oropharynx is clear.  Eyes:     Conjunctiva/sclera: Conjunctivae normal.  Cardiovascular:     Rate and Rhythm: Normal rate and regular rhythm.     Heart sounds: Normal heart sounds. No murmur heard. No friction rub. No gallop.   Pulmonary:     Effort: Pulmonary effort is normal.     Breath sounds: Normal breath sounds. No wheezing, rhonchi or rales.  Abdominal:     Palpations: Abdomen is soft.  Musculoskeletal:     Cervical back: Neck supple.  Skin:    General: Skin is warm.     Findings: No rash.  Neurological:     Mental Status: She is alert and oriented to person, place, and time.  Psychiatric:        Behavior: Behavior normal.    The plan was reviewed with the patient/family, and all questions/concerned were addressed.  It was my pleasure to see Kristen Kaiser today and participate in her care. Please feel free to contact me with any questions or concerns.  Sincerely,  Rexene Alberts, DO Allergy & Immunology  [facility] office: [place] office: [phone]

## 2020-03-26 LAB — SEDIMENTATION RATE: Sed Rate: 9 mm/hr (ref 0–40)

## 2020-03-26 LAB — CBC WITH DIFFERENTIAL/PLATELET
Basophils Absolute: 0 10*3/uL (ref 0.0–0.2)
Basos: 1 %
EOS (ABSOLUTE): 0.1 10*3/uL (ref 0.0–0.4)
Eos: 1 %
Hematocrit: 44.5 % (ref 34.0–46.6)
Hemoglobin: 15.5 g/dL (ref 11.1–15.9)
Immature Grans (Abs): 0 10*3/uL (ref 0.0–0.1)
Immature Granulocytes: 1 %
Lymphocytes Absolute: 1.3 10*3/uL (ref 0.7–3.1)
Lymphs: 28 %
MCH: 31.1 pg (ref 26.6–33.0)
MCHC: 34.8 g/dL (ref 31.5–35.7)
MCV: 89 fL (ref 79–97)
Monocytes Absolute: 0.4 10*3/uL (ref 0.1–0.9)
Monocytes: 8 %
Neutrophils Absolute: 3 10*3/uL (ref 1.4–7.0)
Neutrophils: 61 %
Platelets: 269 10*3/uL (ref 150–450)
RBC: 4.98 x10E6/uL (ref 3.77–5.28)
RDW: 13.1 % (ref 11.7–15.4)
WBC: 4.8 10*3/uL (ref 3.4–10.8)

## 2020-03-26 LAB — COMPREHENSIVE METABOLIC PANEL
ALT: 14 IU/L (ref 0–32)
AST: 17 IU/L (ref 0–40)
Albumin/Globulin Ratio: 1.4 (ref 1.2–2.2)
Albumin: 3.8 g/dL (ref 3.8–4.9)
Alkaline Phosphatase: 98 IU/L (ref 44–121)
BUN/Creatinine Ratio: 19 (ref 9–23)
BUN: 13 mg/dL (ref 6–24)
Bilirubin Total: <0.2 mg/dL (ref 0.0–1.2)
CO2: 25 mmol/L (ref 20–29)
Calcium: 9.4 mg/dL (ref 8.7–10.2)
Chloride: 102 mmol/L (ref 96–106)
Creatinine, Ser: 0.7 mg/dL (ref 0.57–1.00)
GFR calc Af Amer: 114 mL/min/{1.73_m2} (ref >59)
GFR calc non Af Amer: 99 mL/min/{1.73_m2} (ref >59)
Globulin, Total: 2.8 g/dL (ref 1.5–4.5)
Glucose: 78 mg/dL (ref 65–99)
Potassium: 4.1 mmol/L (ref 3.5–5.2)
Sodium: 140 mmol/L (ref 134–144)
Total Protein: 6.6 g/dL (ref 6.0–8.5)

## 2020-03-26 LAB — C-REACTIVE PROTEIN: CRP: 4 mg/L (ref 0–10)

## 2020-03-26 LAB — TRYPTASE: Tryptase: 9.2 ug/L (ref 2.2–13.2)

## 2020-03-26 LAB — ANA W/REFLEX: Anti Nuclear Antibody (ANA): NEGATIVE

## 2020-03-26 LAB — C1 ESTERASE INHIBITOR: C1INH SerPl-mCnc: 27 mg/dL (ref 21–39)

## 2020-03-26 LAB — THYROID CASCADE PROFILE: TSH: 1.93 u[IU]/mL (ref 0.450–4.500)

## 2020-03-26 LAB — COMPLEMENT COMPONENT C1Q: Complement C1Q: 8 mg/dL — ABNORMAL LOW (ref 10.3–20.5)

## 2020-03-26 LAB — CHRONIC URTICARIA: cu index: 3.3 (ref <10)

## 2020-03-26 LAB — C1 ESTERASE INHIBITOR, FUNCTIONAL: C1INH Functional/C1INH Total MFr SerPl: >91 %mean normal

## 2020-03-27 ENCOUNTER — Telehealth: Payer: Self-pay | Admitting: *Deleted

## 2020-03-27 DIAGNOSIS — T783XXD Angioneurotic edema, subsequent encounter: Secondary | ICD-10-CM

## 2020-03-27 NOTE — Telephone Encounter (Signed)
Can you mail the lab order - C3,C4 and C1Q?  Draw it in 1 month.  Thank you.

## 2020-03-27 NOTE — Telephone Encounter (Signed)
Patient returned call to the office regarding lab results.  Lab results given per Dr. Selena Batten.  Patient was informed she needed to have additional lab work done in a few weeks.  She would like to have labs done at [facility] in [place] on [address]. Will clarify the exact time frame with Dr. Selena Batten.  Patient will keep a journal of any issues with angioedema.  She has had a couple of issues since last office visit when she went out to eat.  Patient will continue with her medication regimen.  Patient voiced understanding and will contact patient back with time frame of when she needs to get lab work done.

## 2020-03-27 NOTE — Telephone Encounter (Signed)
Lab order printed and mailed to patient.  Called patient and notified of order mailed and get labs drawn in one month.

## 2020-04-23 ENCOUNTER — Other Ambulatory Visit (HOSPITAL_COMMUNITY): Payer: Self-pay | Admitting: *Deleted

## 2020-04-23 DIAGNOSIS — D0512 Intraductal carcinoma in situ of left breast: Secondary | ICD-10-CM

## 2020-04-24 ENCOUNTER — Other Ambulatory Visit (HOSPITAL_COMMUNITY): Payer: Self-pay | Admitting: *Deleted

## 2020-04-24 ENCOUNTER — Other Ambulatory Visit (HOSPITAL_COMMUNITY): Payer: 59

## 2020-04-24 ENCOUNTER — Other Ambulatory Visit: Payer: Self-pay

## 2020-04-24 ENCOUNTER — Inpatient Hospital Stay (HOSPITAL_COMMUNITY): Payer: 59 | Attending: Hematology

## 2020-04-24 ENCOUNTER — Ambulatory Visit (HOSPITAL_COMMUNITY)
Admission: RE | Admit: 2020-04-24 | Discharge: 2020-04-24 | Disposition: A | Discharge status: Home / Self Care | Payer: 59 | Source: Ambulatory Visit | Attending: Nurse Practitioner | Admitting: Nurse Practitioner

## 2020-04-24 DIAGNOSIS — Z1231 Encounter for screening mammogram for malignant neoplasm of breast: Secondary | ICD-10-CM | POA: Insufficient documentation

## 2020-04-24 DIAGNOSIS — E538 Deficiency of other specified B group vitamins: Secondary | ICD-10-CM | POA: Insufficient documentation

## 2020-04-24 DIAGNOSIS — D0512 Intraductal carcinoma in situ of left breast: Secondary | ICD-10-CM

## 2020-04-24 DIAGNOSIS — M8580 Other specified disorders of bone density and structure, unspecified site: Secondary | ICD-10-CM | POA: Diagnosis not present

## 2020-04-24 LAB — COMPREHENSIVE METABOLIC PANEL
ALT: 18 U/L (ref 0–44)
AST: 22 U/L (ref 15–41)
Albumin: 3.6 g/dL (ref 3.5–5.0)
Alkaline Phosphatase: 76 U/L (ref 38–126)
Anion gap: 5 (ref 5–15)
BUN: 12 mg/dL (ref 6–20)
CO2: 29 mmol/L (ref 22–32)
Calcium: 9 mg/dL (ref 8.9–10.3)
Chloride: 102 mmol/L (ref 98–111)
Creatinine, Ser: 0.68 mg/dL (ref 0.44–1.00)
GFR, Estimated: >60 mL/min (ref >60)
Glucose, Bld: 80 mg/dL (ref 70–99)
Potassium: 4.1 mmol/L (ref 3.5–5.1)
Sodium: 136 mmol/L (ref 135–145)
Total Bilirubin: 0.7 mg/dL (ref 0.3–1.2)
Total Protein: 6.6 g/dL (ref 6.5–8.1)

## 2020-04-24 LAB — CBC WITH DIFFERENTIAL/PLATELET
Abs Immature Granulocytes: 0.01 10*3/uL (ref 0.00–0.07)
Basophils Absolute: 0 10*3/uL (ref 0.0–0.1)
Basophils Relative: 1 %
Eosinophils Absolute: 0.1 10*3/uL (ref 0.0–0.5)
Eosinophils Relative: 2 %
HCT: 42.4 % (ref 36.0–46.0)
Hemoglobin: 14.2 g/dL (ref 12.0–15.0)
Immature Granulocytes: 0 %
Lymphocytes Relative: 27 %
Lymphs Abs: 1.3 10*3/uL (ref 0.7–4.0)
MCH: 30.6 pg (ref 26.0–34.0)
MCHC: 33.5 g/dL (ref 30.0–36.0)
MCV: 91.4 fL (ref 80.0–100.0)
Monocytes Absolute: 0.5 10*3/uL (ref 0.1–1.0)
Monocytes Relative: 10 %
Neutro Abs: 2.8 10*3/uL (ref 1.7–7.7)
Neutrophils Relative %: 60 %
Platelets: 243 10*3/uL (ref 150–400)
RBC: 4.64 MIL/uL (ref 3.87–5.11)
RDW: 12.6 % (ref 11.5–15.5)
WBC: 4.7 10*3/uL (ref 4.0–10.5)
nRBC: 0 % (ref 0.0–0.2)

## 2020-04-24 LAB — LACTATE DEHYDROGENASE: LDH: 172 U/L (ref 98–192)

## 2020-04-24 LAB — VITAMIN B12: Vitamin B-12: 167 pg/mL — ABNORMAL LOW (ref 180–914)

## 2020-04-24 LAB — VITAMIN D 25 HYDROXY (VIT D DEFICIENCY, FRACTURES): Vit D, 25-Hydroxy: 36.02 ng/mL (ref 30–100)

## 2020-04-24 MED ORDER — TAMOXIFEN CITRATE 20 MG PO TABS: 20.0000 mg | ORAL_TABLET | Freq: Every day | ORAL | 0 refills | Status: DC

## 2020-04-28 ENCOUNTER — Other Ambulatory Visit (HOSPITAL_COMMUNITY): Payer: Self-pay | Admitting: Surgery

## 2020-04-28 DIAGNOSIS — D0512 Intraductal carcinoma in situ of left breast: Secondary | ICD-10-CM

## 2020-04-28 MED ORDER — CITALOPRAM HYDROBROMIDE 40 MG PO TABS: 40.0000 mg | ORAL_TABLET | Freq: Every day | ORAL | 0 refills | Status: DC

## 2020-04-29 ENCOUNTER — Other Ambulatory Visit: Payer: Self-pay

## 2020-04-29 ENCOUNTER — Other Ambulatory Visit (HOSPITAL_COMMUNITY): Payer: Self-pay

## 2020-04-29 ENCOUNTER — Encounter (HOSPITAL_COMMUNITY): Payer: Self-pay

## 2020-04-29 ENCOUNTER — Inpatient Hospital Stay (HOSPITAL_BASED_OUTPATIENT_CLINIC_OR_DEPARTMENT_OTHER): Payer: 59 | Admitting: Oncology

## 2020-04-29 VITALS — BP 136/86 | HR 83 | Temp 97.8°F | Resp 18 | Wt 183.9 lb

## 2020-04-29 DIAGNOSIS — D0512 Intraductal carcinoma in situ of left breast: Secondary | ICD-10-CM

## 2020-04-29 DIAGNOSIS — E538 Deficiency of other specified B group vitamins: Secondary | ICD-10-CM | POA: Diagnosis not present

## 2020-04-29 MED ORDER — CYANOCOBALAMIN 1000 MCG/ML IJ SOLN: 1000.0000 ug | INTRAMUSCULAR | Status: DC

## 2020-04-29 MED ORDER — CYANOCOBALAMIN 1000 MCG/ML IJ SOLN
1000.0000 ug | Freq: Once | INTRAMUSCULAR | Status: AC
  Administered 2020-04-29: 1000 ug via INTRAMUSCULAR
  Filled 2020-04-29: qty 1

## 2020-04-29 NOTE — Progress Notes (Signed)
.  Kristen Kaiser presents today for injection per the provider's orders. B12 administration without incident; injection site WNL; see MAR for injection details.  Patient tolerated procedure well and without incident.  No questions or complaints noted at this time. Pt discharged ambulatory in satisfactory condition.

## 2020-04-29 NOTE — Progress Notes (Signed)
BCI ordered per Rulon Abide, NP

## 2020-04-29 NOTE — Progress Notes (Signed)
[place], [place] [address]   CLINIC:  Medical Oncology/Hematology  PCP:  Caryl Bis, MD [place] [address] [phone]   REASON FOR VISIT: Follow-up for breast cancer   CURRENT THERAPY: Tamoxifen   INTERVAL HISTORY:  Kristen Kaiser 55 y.o. female returns for routine follow-up for breast cancer.  She was last seen in clinic on 11/20/2019.  In the interim she has done well.  She was seen by an allergy and asthma specialist on 03/18/2020 secondary to several episodes of angioedema thought to be due to lisinopril.  She was switched to hydrochlorothiazide.  She was instructed to start Zyrtec 10 mg twice a day and Pepcid 20 mg twice a day.  Allergy testing did reveal that she was allergic to red meat and pork.   She had a mammogram on 04/24/2020 which was BI-RADS Category 1 or negative.  She continues to tolerate tamoxifen well.  She denies any new lumps or bumps.  Has daily hot flashes but they are tolerable. Denies any new pains.  She admits to slowly recovering from Covid.  She still does not feel completely "over it".  Tested positive a few weeks back.  REVIEW OF SYSTEMS:  Review of Systems  Respiratory: Positive for chest tightness and cough.   Endocrine: Positive for hot flashes.  All other systems reviewed and are negative.    PAST MEDICAL/SURGICAL HISTORY:  Past Medical History:  Diagnosis Date  . Angio-edema   . Breast cancer ([place]) 2016  . COVID-19   . Eczema   . Hypertension   . Personal history of radiation therapy 2016  . Urticaria    Past Surgical History:  Procedure Laterality Date  . BREAST LUMPECTOMY Left 2016  . CESAREAN SECTION       SOCIAL HISTORY:  Social History   Socioeconomic History  . Marital status: Married    Spouse name: Not on file  . Number of children: Not on file  . Years of education: Not on file  . Highest education level: Not on file  Occupational History  . Not on file   Tobacco Use  . Smoking status: Former Smoker    Packs/day: 1.00    Years: 15.00    Pack years: 15.00    Types: Cigarettes    Quit date: 12/18/1998    Years since quitting: 21.3  . Smokeless tobacco: Never Used  Vaping Use  . Vaping Use: Never used  Substance and Sexual Activity  . Alcohol use: Yes    Comment: occ  . Drug use: Never  . Sexual activity: Yes    Birth control/protection: Post-menopausal  Other Topics Concern  . Not on file  Social History Narrative  . Not on file   Social Determinants of Health   Financial Resource Strain: Low Risk   . Difficulty of Paying Living Expenses: Not hard at all  Food Insecurity: No Food Insecurity  . Worried About Charity fundraiser in the Last Year: Never true  . Ran Out of Food in the Last Year: Never true  Transportation Needs: No Transportation Needs  . Lack of Transportation (Medical): No  . Lack of Transportation (Non-Medical): No  Physical Activity: Inactive  . Days of Exercise per Week: 0 days  . Minutes of Exercise per Session: 0 min  Stress: No Stress Concern Present  . Feeling of Stress : Only a little  Social Connections: Moderately Integrated  . Frequency of Communication  with Friends and Family: More than three times a week  . Frequency of Social Gatherings with Friends and Family: Once a week  . Attends Religious Services: More than 4 times per year  . Active Member of Clubs or Organizations: No  . Attends Archivist Meetings: Never  . Marital Status: Married  Human resources officer Violence: Not At Risk  . Fear of Current or Ex-Partner: No  . Emotionally Abused: No  . Physically Abused: No  . Sexually Abused: No    FAMILY HISTORY:  Family History  Problem Relation Age of Onset  . Cancer Paternal Grandfather   . Asthma Paternal Grandfather   . Alzheimer's disease Paternal Grandmother   . Congestive Heart Failure Maternal Grandmother   . Cancer Maternal Grandfather        rectal  . Atrial  fibrillation Father   . Atrial fibrillation Mother   . Hypertension Brother   . Hypertension Sister   . Allergic rhinitis Neg Hx   . Eczema Neg Hx   . Urticaria Neg Hx     CURRENT MEDICATIONS:  Outpatient Encounter Medications as of 04/29/2020  Medication Sig  . cetirizine (ZYRTEC ALLERGY) 10 MG tablet Take 1 tablet (10 mg total) by mouth daily.  . citalopram (CELEXA) 40 MG tablet Take 1 tablet (40 mg total) by mouth daily.  . famotidine (PEPCID) 20 MG tablet Take 1 tablet (20 mg total) by mouth 2 (two) times daily.  Marland Kitchen gabapentin (NEURONTIN) 300 MG capsule TAKE 1 CAPSULE BY MOUTH EVERYDAY AT BEDTIME  . hydrochlorothiazide (HYDRODIURIL) 12.5 MG tablet Take 12.5 mg by mouth daily.  . tamoxifen (NOLVADEX) 20 MG tablet Take 1 tablet (20 mg total) by mouth daily.  Marland Kitchen EPINEPHrine 0.3 mg/0.3 mL IJ SOAJ injection SMARTSIG:0.3 Milliliter(s) IM Once PRN (Patient not taking: Reported on 04/29/2020)  . predniSONE (DELTASONE) 20 MG tablet Take by mouth. (Patient not taking: Reported on 04/29/2020)  . [DISCONTINUED] losartan-hydrochlorothiazide (HYZAAR) 50-12.5 MG tablet Take 1 tablet by mouth daily.   No facility-administered encounter medications on file as of 04/29/2020.    ALLERGIES:  Allergies  Allergen Reactions  . Lisinopril Swelling     PHYSICAL EXAM:  ECOG Performance status: 1  Vitals:   04/29/20 1053  BP: 136/86  Pulse: 83  Resp: 18  Temp: 97.8 F (36.6 C)  SpO2: 94%   Filed Weights   04/29/20 1053  Weight: 183 lb 13.8 oz (83.4 kg)   Physical Exam Constitutional:      Appearance: Normal appearance. She is normal weight.  Cardiovascular:     Rate and Rhythm: Normal rate and regular rhythm.     Heart sounds: Normal heart sounds.  Pulmonary:     Effort: Pulmonary effort is normal.     Breath sounds: Normal breath sounds.  Abdominal:     General: Bowel sounds are normal.     Palpations: Abdomen is soft.  Musculoskeletal:        General: Normal range of motion.  Skin:     General: Skin is warm.  Neurological:     Mental Status: She is alert and oriented to person, place, and time. Mental status is at baseline.  Psychiatric:        Mood and Affect: Mood normal.        Behavior: Behavior normal.        Thought Content: Thought content normal.        Judgment: Judgment normal.      LABORATORY DATA:  I have  reviewed the labs as listed.  CBC    Component Value Date/Time   WBC 4.7 04/24/2020 0940   RBC 4.64 04/24/2020 0940   HGB 14.2 04/24/2020 0940   HGB 15.5 03/18/2020 1054   HCT 42.4 04/24/2020 0940   HCT 44.5 03/18/2020 1054   PLT 243 04/24/2020 0940   PLT 269 03/18/2020 1054   MCV 91.4 04/24/2020 0940   MCV 89 03/18/2020 1054   MCH 30.6 04/24/2020 0940   MCHC 33.5 04/24/2020 0940   RDW 12.6 04/24/2020 0940   RDW 13.1 03/18/2020 1054   LYMPHSABS 1.3 04/24/2020 0940   LYMPHSABS 1.3 03/18/2020 1054   MONOABS 0.5 04/24/2020 0940   EOSABS 0.1 04/24/2020 0940   EOSABS 0.1 03/18/2020 1054   BASOSABS 0.0 04/24/2020 0940   BASOSABS 0.0 03/18/2020 1054   CMP Latest Ref Rng & Units 04/24/2020 03/18/2020 11/20/2019  Glucose 70 - 99 mg/dL 80 78 102(H)  BUN 6 - 20 mg/dL 12 13 12   Creatinine 0.44 - 1.00 mg/dL 0.68 0.70 0.71  Sodium 135 - 145 mmol/L 136 140 138  Potassium 3.5 - 5.1 mmol/L 4.1 4.1 4.0  Chloride 98 - 111 mmol/L 102 102 103  CO2 22 - 32 mmol/L 29 25 25   Calcium 8.9 - 10.3 mg/dL 9.0 9.4 9.0  Total Protein 6.5 - 8.1 g/dL 6.6 6.6 6.9  Total Bilirubin 0.3 - 1.2 mg/dL 0.7 <0.2 0.6  Alkaline Phos 38 - 126 U/L 76 98 79  AST 15 - 41 U/L 22 17 22   ALT 0 - 44 U/L 18 14 16     All questions were answered to patient's stated satisfaction. Encouraged patient to call with any new concerns or questions before his next visit to the cancer center and we can certain see him sooner, if needed.     ASSESSMENT & PLAN:  1.  High-grade DCIS of left breast: -Status post lumpectomy on 01/27/2015 with pathology showing DCIS, high-grade with  calcifications. -Status post radiation therapy and tamoxifen started in March 2017. -She is continuing to tolerate tamoxifen with some hot flashes. -Last mammogram on 04/24/2020 was BI-RADS Category 2 benign. -Physical examination not reveal any changes in the lumpectomy scar. -Labs done on  04/24/2020 show unremarkable CBC and CMP. -Vitamin B12 levels are low -She will complete 5 years of tamoxifen in March 2022.  -We have sent off BCI testing to determine if she should continue tamoxifen > 5 years. Adonis Huguenin, RN will let us know when this gets completed.   2.  Osteopenia: -DEXA scan on 05/10/2019 showed T score of -1.2. -She will continue taking calcium and vitamin D daily.  3.  Hot flashes: -She is taking gabapentin 300 mg at bedtime.  Unsure if it is actually helping. -She is still having hot flashes. -I have asked that she take her tamoxifen in the morning and gabapentin at bedtime to see if this helps her hot flashes.  4.  Vitamin B12 deficiency: -Labs from 04/24/2020 show a vitamin D level of 167. -Start monthly B12 injections. -She will get her first injection today.  Disposition: -RTC monthly for B12 injections. -If BCI testing is back point we will review and determine if she would benefit from additional antihormone treatment.  Patient is in agreement with this plan.  -RTC in 6 months for labs (CBC, CMP, LDH, vitamin B12 and vitamin D), MD assessment and vitamin B12 injection.   Greater than 50% was spent in counseling and coordination of care with this patient including but  not limited to discussion of the relevant topics above (See A&P) including, but not limited to diagnosis and management of acute and chronic medical conditions.   Orders placed this encounter:  No orders of the defined types were placed in this encounter.  Faythe Casa, NP 04/29/2020 3:32 PM  [place] [phone]

## 2020-04-30 ENCOUNTER — Telehealth: Payer: Self-pay

## 2020-04-30 NOTE — Telephone Encounter (Signed)
Attempted to contact patient to schedule covid vaccine but there was no answer.

## 2020-05-01 ENCOUNTER — Ambulatory Visit (HOSPITAL_COMMUNITY): Payer: 59 | Admitting: Nurse Practitioner

## 2020-05-06 NOTE — Telephone Encounter (Signed)
Patient called stating she didn't get the lab results in the mail. I have re-sent them to the verified address on file.

## 2020-05-11 ENCOUNTER — Other Ambulatory Visit (HOSPITAL_COMMUNITY): Payer: Self-pay | Admitting: Hematology

## 2020-05-12 ENCOUNTER — Other Ambulatory Visit (HOSPITAL_COMMUNITY): Payer: Self-pay

## 2020-05-12 ENCOUNTER — Other Ambulatory Visit (HOSPITAL_COMMUNITY): Payer: Self-pay | Admitting: Hematology

## 2020-05-12 DIAGNOSIS — D0512 Intraductal carcinoma in situ of left breast: Secondary | ICD-10-CM

## 2020-05-12 MED ORDER — GABAPENTIN 300 MG PO CAPS: ORAL_CAPSULE | ORAL | 5 refills | Status: DC

## 2020-05-21 ENCOUNTER — Ambulatory Visit (INDEPENDENT_AMBULATORY_CARE_PROVIDER_SITE_OTHER): Payer: 59 | Admitting: Allergy & Immunology

## 2020-05-21 ENCOUNTER — Other Ambulatory Visit: Payer: Self-pay

## 2020-05-21 VITALS — BP 140/80 | HR 75 | Temp 98.2°F | Resp 18 | Ht 62.5 in | Wt 183.6 lb

## 2020-05-21 DIAGNOSIS — T783XXD Angioneurotic edema, subsequent encounter: Secondary | ICD-10-CM

## 2020-05-21 DIAGNOSIS — L299 Pruritus, unspecified: Secondary | ICD-10-CM | POA: Diagnosis not present

## 2020-05-21 DIAGNOSIS — T7800XD Anaphylactic reaction due to unspecified food, subsequent encounter: Secondary | ICD-10-CM | POA: Diagnosis not present

## 2020-05-21 DIAGNOSIS — R21 Rash and other nonspecific skin eruption: Secondary | ICD-10-CM

## 2020-05-21 LAB — C3 AND C4
Complement C3, Serum: 137 mg/dL (ref 82–167)
Complement C4, Serum: 16 mg/dL (ref 12–38)

## 2020-05-21 LAB — COMPLEMENT COMPONENT C1Q: Complement C1Q: 10.1 mg/dL — ABNORMAL LOW (ref 10.3–20.5)

## 2020-05-21 NOTE — Progress Notes (Signed)
FOLLOW UP  Date of Service/Encounter:  05/21/20   Assessment:   Angioedema - improved with mammalian meat avoidance  Alpha gal syndrome  Vitamin B12 deficiency - improving on supplements  History of breast cancer - on tamoxifen   [name] is actually doing quite well on the current regimen.  We are to continue to avoid mammalian meat.  She does have her EpiPen to use as needed.  We did provide demonstration on how to use it, as she never received this previously.  She has an Chidester which is a little bit more confusing than a simple EpiPen.  Hopefully, she will never have to use it anyway.  We are going to recheck her alpha gal levels at the next visit to do her things are trending.  Plan/Recommendations:   1. Angioedema - All of your other workup was negative. - Vitamin B12 was higher at 167, but still not normal. - Continue with the B12 injections as scheduled. - Thankfully you do have normal hemoglobin and hematocrit which is great.  - EpiPen is up to date. - We can check your alpha gal levels at the next visit to see where it is trending.   2. Return in about 6 months (around 11/21/2020).    Subjective:   Kristen Kaiser is a 55 y.o. female presenting today for follow up of No chief complaint on file.   Kristen Kaiser has a history of the following: Patient Active Problem List   Diagnosis Date Noted  . B12 deficiency 04/29/2020  . Angio-edema 03/18/2020  . Rash and other nonspecific skin eruption 03/18/2020  . Pruritus 03/18/2020  . Adverse reaction to food, subsequent encounter 03/18/2020  . Ductal carcinoma in situ (DCIS) of left breast 12/18/2015    History obtained from: chart review and patient.  Kristen Kaiser is a 55 y.o. female presenting for a follow up visit.  She was last seen in December 2021 by Dr. Maudie Mercury.  At that time, she presented with multiple episodes of angioedema.  This all started around 1 month prior to being seen.  She has skin testing that was negative  for environmental allergies and selected foods.  She was started on cetirizine 10 mg twice daily as well as famotidine 20 mg twice daily.  Blood work was obtained that was largely normal. She had a previous test that was positive to alpha gal, which was done at her PCP's office.   Since the last visit, she has mostly done well. She actually came to her with a diagnosis of alpha gal. Dr. Maudie Mercury felt that she was on the border and she had additional lab work done.    She did have a B12 that was low. It was around 100 at her Oncologist (she sees someone due to a history of breast cancer). She is on B12 shots at her Oncologist office. She has had three injections. She thinks that this might be related to decreasing and eliminating her B12 in her diet. Her CBC has never showed any anemia at all. She is having weekly injections.   She does eat vegetable and fruits. So she thinks that she should have enough B12 from other places. She denies any symptoms of reflux. Since having alpha gal, she has noted that sometimes when she eats, she will have some throat and swelling with cross contamination.   She has noticed some broken capillaries on her back. She is on the cetirizine daily. Sometimes she adds on an extra if needed.  This is especially done when she knows there has been some cross contamination.    She did have the red meat acupuncture for treatment of her red meat. She does eat a small amount of red meat but she does take some Benadryl before doing this.   Otherwise, there have been no changes to her past medical history, surgical history, family history, or social history.    Review of Systems  Constitutional: Negative.  Negative for fever, malaise/fatigue and weight loss.  HENT: Negative.  Negative for congestion, ear discharge and ear pain.   Eyes: Negative for pain, discharge and redness.  Respiratory: Negative for cough, sputum production, shortness of breath and wheezing.   Cardiovascular:  Negative.  Negative for chest pain and palpitations.  Gastrointestinal: Negative for abdominal pain, blood in stool, constipation, diarrhea, heartburn, nausea and vomiting.  Skin: Negative.  Negative for itching and rash.  Neurological: Negative for dizziness and headaches.  Endo/Heme/Allergies: Negative for environmental allergies. Does not bruise/bleed easily.       Objective:   Blood pressure 140/80, pulse 75, temperature 98.2 F (36.8 C), temperature source Temporal, resp. rate 18, height 5' 2.5" (1.588 m), weight 183 lb 9.6 oz (83.3 kg), SpO2 97 %. Body mass index is 33.05 kg/m.   Physical Exam:  Physical Exam Constitutional:      Appearance: She is well-developed.     Comments: Very bubbly and pleasant.  HENT:     Head: Normocephalic and atraumatic.     Right Ear: Tympanic membrane, ear canal and external ear normal.     Left Ear: Tympanic membrane, ear canal and external ear normal.     Nose: No nasal deformity, septal deviation, mucosal edema, rhinorrhea or epistaxis.     Right Turbinates: Enlarged and swollen.     Left Turbinates: Enlarged and swollen.     Right Sinus: No maxillary sinus tenderness or frontal sinus tenderness.     Left Sinus: No maxillary sinus tenderness or frontal sinus tenderness.     Mouth/Throat:     Mouth: Oropharynx is clear and moist. Mucous membranes are not pale and not dry.     Pharynx: Uvula midline.  Eyes:     General: Allergic shiner present.        Right eye: No discharge.        Left eye: No discharge.     Extraocular Movements: EOM normal.     Conjunctiva/sclera: Conjunctivae normal.     Right eye: Right conjunctiva is not injected. No chemosis.    Left eye: Left conjunctiva is not injected. No chemosis.    Pupils: Pupils are equal, round, and reactive to light.  Cardiovascular:     Rate and Rhythm: Normal rate and regular rhythm.     Heart sounds: Normal heart sounds.  Pulmonary:     Effort: Pulmonary effort is normal. No  tachypnea, accessory muscle usage or respiratory distress.     Breath sounds: Normal breath sounds. No wheezing, rhonchi or rales.     Comments: Moving air well in all lung fields.  No increased work of breathing. Chest:     Chest wall: No tenderness.  Lymphadenopathy:     Cervical: No cervical adenopathy.  Skin:    General: Skin is warm.     Capillary Refill: Capillary refill takes less than 2 seconds.     Coloration: Skin is not pale.     Findings: No abrasion, erythema, petechiae or rash. Rash is not papular, urticarial or vesicular.  Comments: No eczematous or urticarial lesions noted.  Neurological:     Mental Status: She is alert.  Psychiatric:        Mood and Affect: Mood and affect normal.        Behavior: Behavior is cooperative.      Diagnostic studies: none     Salvatore Marvel, MD  [facility]

## 2020-05-21 NOTE — Patient Instructions (Addendum)
1. Angioedema - All of your other workup was negative. - Vitamin B12 was higher at 167, but still not normal. - Continue with the B12 injections as scheduled. - Thankfully you do have normal hemoglobin and hematocrit which is great.  - EpiPen is up to date. - We can check your alpha gal levels at the next visit to see where it is trending.   2. Return in about 6 months (around 11/21/2020).    Please inform us of any Emergency Department visits, hospitalizations, or changes in symptoms. Call us before going to the ED for breathing or allergy symptoms since we might be able to fit you in for a sick visit. Feel free to contact us anytime with any questions, problems, or concerns.  It was a pleasure to meet you today!  Websites that have reliable patient information: 1. American Academy of Asthma, Allergy, and Immunology: www.aaaai.org 2. Food Allergy Research and Education (FARE): foodallergy.org 3. Mothers of Asthmatics: http://www.asthmacommunitynetwork.org 4. American College of Allergy, Asthma, and Immunology: www.acaai.org   COVID-19 Vaccine Information can be found at: ShippingScam.co.uk For questions related to vaccine distribution or appointments, please email vaccine@[email or web address] [email or web address] or call [phone].   We realize that you might be concerned about having an allergic reaction to the COVID19 vaccines. To help with that concern, WE ARE OFFERING THE COVID19 VACCINES IN OUR OFFICE! Ask the front desk for dates!     "Like" [name] on Facebook and Instagram for our latest updates!      A healthy democracy works best when [employer] participate! Make sure you are registered to vote! If you have moved or changed any of your contact information, you will need to get this updated before voting!  In some cases, you MAY be able to register to vote online: [email or web address]

## 2020-05-23 ENCOUNTER — Encounter: Payer: Self-pay | Admitting: Allergy & Immunology

## 2020-05-27 ENCOUNTER — Ambulatory Visit (HOSPITAL_COMMUNITY): Payer: 59

## 2020-06-06 ENCOUNTER — Telehealth: Payer: Self-pay | Admitting: Oncology

## 2020-06-06 NOTE — Telephone Encounter (Signed)
Re: Tamoxifen  Patient completed 5 years of tamoxifen this month.  She would like to stop tamoxifen.  Unable to complete BCI testing due to not being recommended for high-grade DCIS.  According to the NCCN guidelines BCI is the only test that predicts benefit from extended endocrine therapy in early stage HR positive breast cancer.   I have spoken the patient and let her know that unfortunately her BCI testing was not completed.  We will reach out to Dr. Delton Coombes to make sure she is okay to stop her tamoxifen at this time.  Faythe Casa, NP 06/06/2020 3:09 PM

## 2020-06-27 ENCOUNTER — Ambulatory Visit (HOSPITAL_COMMUNITY): Payer: 59

## 2020-07-25 ENCOUNTER — Ambulatory Visit (HOSPITAL_COMMUNITY): Payer: 59

## 2020-08-22 ENCOUNTER — Ambulatory Visit (HOSPITAL_COMMUNITY): Payer: 59

## 2020-09-19 ENCOUNTER — Ambulatory Visit (HOSPITAL_COMMUNITY): Payer: 59

## 2020-09-30 ENCOUNTER — Telehealth: Payer: Self-pay

## 2020-09-30 MED ORDER — CETIRIZINE HCL 10 MG PO TABS: 10.0000 mg | ORAL_TABLET | Freq: Two times a day (BID) | ORAL | 2 refills | Status: DC

## 2020-09-30 NOTE — Telephone Encounter (Signed)
Sent new rx to [facility]

## 2020-09-30 NOTE — Telephone Encounter (Signed)
Patient called stating her old Zyrtec prescription needs to be updated with 2x/day instead one 1x/day.    [facility]  Please Advise

## 2020-10-28 ENCOUNTER — Inpatient Hospital Stay (HOSPITAL_COMMUNITY): Payer: 59

## 2020-11-03 ENCOUNTER — Other Ambulatory Visit (HOSPITAL_COMMUNITY): Payer: Self-pay | Admitting: Hematology

## 2020-11-04 ENCOUNTER — Ambulatory Visit (HOSPITAL_COMMUNITY): Payer: 59

## 2020-11-04 ENCOUNTER — Ambulatory Visit (HOSPITAL_COMMUNITY): Payer: 59 | Admitting: Hematology

## 2020-11-04 ENCOUNTER — Inpatient Hospital Stay (HOSPITAL_COMMUNITY): Payer: 59

## 2020-11-11 ENCOUNTER — Ambulatory Visit (HOSPITAL_COMMUNITY): Payer: 59 | Admitting: Hematology and Oncology

## 2020-11-18 ENCOUNTER — Other Ambulatory Visit (HOSPITAL_COMMUNITY): Payer: Self-pay

## 2020-11-18 ENCOUNTER — Encounter (HOSPITAL_COMMUNITY): Payer: Self-pay

## 2020-11-18 DIAGNOSIS — E538 Deficiency of other specified B group vitamins: Secondary | ICD-10-CM

## 2020-11-18 DIAGNOSIS — D0512 Intraductal carcinoma in situ of left breast: Secondary | ICD-10-CM

## 2020-11-18 NOTE — Progress Notes (Signed)
Patient called wanting to know when her appointment is and to see if we could fax lab orders to her primary care provider [facility]. Orders placed per Faythe Casa, NP note from 04/29/2020 for CBC, CMP, LDH, vitamin B12 and vitamin D.

## 2020-11-19 ENCOUNTER — Ambulatory Visit: Payer: 59 | Admitting: Allergy & Immunology

## 2020-12-09 ENCOUNTER — Inpatient Hospital Stay (HOSPITAL_COMMUNITY): Payer: 59

## 2020-12-15 NOTE — Progress Notes (Signed)
[facility] [address],  [address]   Patient Care Team: Caryl Bis, MD as PCP - General (Family Medicine) Derek Jack, MD as Consulting Physician (Hematology)  CHIEF COMPLIANT: Follow-up for Ductal carcinoma in situ (DCIS) of left breast   INTERVAL HISTORY: Kristen Kaiser is a 55 y.o. female here today for follow up of her left breast DCIS. Her last visit was on 04/29/2020 by Faythe Casa, NP.   Today she reports feeling good. She stopped Tamoxifen in March after completing 5 years. She continues to have severe hot flashes 10 times a daily. She reports muscle spasms in her legs for which she is taking Gabapentin. She takes tylenol as needed for occasional headaches.   REVIEW OF SYSTEMS:   Review of Systems  Constitutional:  Negative for appetite change and fatigue (75%).  Endocrine: Positive for hot flashes.  Musculoskeletal:  Positive for arthralgias (L leg).  Neurological:  Positive for dizziness and numbness (hands).  All other systems reviewed and are negative.  I have reviewed the past medical history, past surgical history, social history and family history with the patient and they are unchanged from previous note.   ALLERGIES:   is allergic to lisinopril.   MEDICATIONS:  Current Outpatient Medications  Medication Sig Dispense Refill   cetirizine (ZYRTEC ALLERGY) 10 MG tablet Take 1 tablet (10 mg total) by mouth 2 (two) times daily. 60 tablet 2   citalopram (CELEXA) 40 MG tablet TAKE 1 TABLET BY MOUTH EVERY DAY 90 tablet 4   gabapentin (NEURONTIN) 300 MG capsule TAKE 1 CAPSULE BY MOUTH EVERYDAY AT BEDTIME 30 capsule 5   hydrochlorothiazide (HYDRODIURIL) 12.5 MG tablet Take 12.5 mg by mouth daily.     EPINEPHrine 0.3 mg/0.3 mL IJ SOAJ injection SMARTSIG:0.3 Milliliter(s) IM Once PRN (Patient not taking: No sig reported)     famotidine (PEPCID) 20 MG tablet Take 1 tablet (20 mg total) by mouth 2 (two) times daily. (Patient  not taking: Reported on 12/16/2020) 60 tablet 5   No current facility-administered medications for this visit.     PHYSICAL EXAMINATION: Performance status (ECOG): 1 - Symptomatic but completely ambulatory  Vitals:   12/16/20 1103  BP: 130/83  Pulse: 80  Resp: 17  Temp: (!) 97 F (36.1 C)  SpO2: 97%   Wt Readings from Last 3 Encounters:  12/16/20 180 lb (81.6 kg)  05/21/20 183 lb 9.6 oz (83.3 kg)  04/29/20 183 lb 13.8 oz (83.4 kg)   Physical Exam  Breast Exam Chaperone: Thana Ates     LABORATORY DATA:  I have reviewed the data as listed CMP Latest Ref Rng & Units 04/24/2020 03/18/2020 11/20/2019  Glucose 70 - 99 mg/dL 80 78 102(H)  BUN 6 - 20 mg/dL '12 13 12  ' Creatinine 0.44 - 1.00 mg/dL 0.68 0.70 0.71  Sodium 135 - 145 mmol/L 136 140 138  Potassium 3.5 - 5.1 mmol/L 4.1 4.1 4.0  Chloride 98 - 111 mmol/L 102 102 103  CO2 22 - 32 mmol/L '29 25 25  ' Calcium 8.9 - 10.3 mg/dL 9.0 9.4 9.0  Total Protein 6.5 - 8.1 g/dL 6.6 6.6 6.9  Total Bilirubin 0.3 - 1.2 mg/dL 0.7 <0.2 0.6  Alkaline Phos 38 - 126 U/L 76 98 79  AST 15 - 41 U/L '22 17 22  ' ALT 0 - 44 U/L '18 14 16   ' No results found for: [id number] Lab Results  Component Value Date   WBC 4.7 04/24/2020  HGB 14.2 04/24/2020   HCT 42.4 04/24/2020   MCV 91.4 04/24/2020   PLT 243 04/24/2020   NEUTROABS 2.8 04/24/2020    ASSESSMENT:  1.  High-grade DCIS of the left breast: -Status post lumpectomy on 01/27/2015 with pathology showing DCIS, high-grade with calcifications. -Status post radiation therapy and tamoxifen started in March 2017. -Tamoxifen from March 2017 through March 2022.   2.  Hot flashes: -She is continuing Celexa 20 mg daily and gabapentin 300 mg at bedtime. -She has on and off hot flashes.   3.  Osteopenia: -DEXA scan on 04/24/2019 reviewed by me shows T score of -1.2. -She will continue vitamin D supplements.  She will continue weightbearing exercises.  We will plan to repeat DEXA scan in 2 to 3 years.      PLAN:  1.  High-grade DCIS of the left breast: - Tamoxifen discontinued in March 2000 5:22 years. - We will plan to repeat mammogram in February. - RTC 6 months for follow-up.  After that I will switch her to once a year visits. - Labs on 11/27/2020 shows vitamin B12 normal at 509.  She discontinued injections. - She has mildly elevated alk phos of 122 and ALT of 36.  We will plan to monitor in 6 months.    2.  Hot flashes: - She is taking gabapentin 300 mg at bedtime.  It also controls her muscle spasms. - She is having hot flashes during the daytime up to 10/day. - She was told to try black cohosh.  If no improvement will try pharmacological interventions.    3.  Osteopenia: - Reviewed labs from [facility].  They are dated 11/27/2020.  Vitamin D level was 41. - Continue calcium and vitamin D supplements.     Breast Cancer therapy associated bone loss: I have recommended calcium, Vitamin D and weight bearing exercises.  Orders placed this encounter:  No orders of the defined types were placed in this encounter.   The patient has a good understanding of the overall plan. She agrees with it. She will call with any problems that may develop before the next visit here.  Derek Jack, MD [place] [phone]   I, Thana Ates, am acting as a scribe for Dr. Derek Jack.  I, Derek Jack MD, have reviewed the above documentation for accuracy and completeness, and I agree with the above.

## 2020-12-16 ENCOUNTER — Inpatient Hospital Stay (HOSPITAL_COMMUNITY): Payer: 59 | Attending: Hematology | Admitting: Hematology

## 2020-12-16 ENCOUNTER — Other Ambulatory Visit: Payer: Self-pay

## 2020-12-16 VITALS — BP 130/83 | HR 80 | Temp 97.0°F | Resp 17 | Wt 180.0 lb

## 2020-12-16 DIAGNOSIS — Z79899 Other long term (current) drug therapy: Secondary | ICD-10-CM | POA: Insufficient documentation

## 2020-12-16 DIAGNOSIS — D0512 Intraductal carcinoma in situ of left breast: Secondary | ICD-10-CM

## 2020-12-16 DIAGNOSIS — R232 Flushing: Secondary | ICD-10-CM | POA: Diagnosis not present

## 2020-12-16 DIAGNOSIS — Z86000 Personal history of in-situ neoplasm of breast: Secondary | ICD-10-CM | POA: Insufficient documentation

## 2020-12-16 DIAGNOSIS — M8580 Other specified disorders of bone density and structure, unspecified site: Secondary | ICD-10-CM | POA: Diagnosis not present

## 2020-12-16 NOTE — Patient Instructions (Addendum)
[facility] at [facility] Discharge Instructions  You were seen today by Dr. Delton Coombes. He went over your recent results. You will be scheduled for a mammogram on 04/24/2021. Purchase Black Cohosh over the counter and take as needed for your hot flashes. Dr. Delton Coombes will see you back in 6 months for labs and follow up.   Thank you for choosing [facility] at [facility] to provide your oncology and hematology care.  To afford each patient quality time with our provider, please arrive at least 15 minutes before your scheduled appointment time.   If you have a lab appointment with the [facility] please come in thru the Main Entrance and check in at the main information desk  You need to re-schedule your appointment should you arrive 10 or more minutes late.  We strive to give you quality time with our providers, and arriving late affects you and other patients whose appointments are after yours.  Also, if you no show three or more times for appointments you may be dismissed from the clinic at the providers discretion.     Again, thank you for choosing [facility].  Our hope is that these requests will decrease the amount of time that you wait before being seen by our physicians.       _____________________________________________________________  Should you have questions after your visit to [facility], please contact our office at [phone] between the hours of 8:00 a.m. and 4:30 p.m.  Voicemails left after 4:00 p.m. will not be returned until the following business day.  For prescription refill requests, have your pharmacy contact our office and allow 72 hours.    Cancer Center Support Programs:   > Cancer Support Group  2nd Tuesday of the month 1pm-2pm, Journey Room

## 2020-12-22 ENCOUNTER — Encounter: Payer: Self-pay | Admitting: Family Medicine

## 2020-12-22 ENCOUNTER — Other Ambulatory Visit: Payer: Self-pay

## 2020-12-22 ENCOUNTER — Ambulatory Visit (INDEPENDENT_AMBULATORY_CARE_PROVIDER_SITE_OTHER): Payer: 59 | Admitting: Family Medicine

## 2020-12-22 VITALS — BP 134/86 | HR 78 | Temp 97.3°F | Resp 18

## 2020-12-22 DIAGNOSIS — T781XXD Other adverse food reactions, not elsewhere classified, subsequent encounter: Secondary | ICD-10-CM | POA: Diagnosis not present

## 2020-12-22 DIAGNOSIS — T783XXD Angioneurotic edema, subsequent encounter: Secondary | ICD-10-CM

## 2020-12-22 DIAGNOSIS — R42 Dizziness and giddiness: Secondary | ICD-10-CM | POA: Diagnosis not present

## 2020-12-22 DIAGNOSIS — T781XXA Other adverse food reactions, not elsewhere classified, initial encounter: Secondary | ICD-10-CM

## 2020-12-22 MED ORDER — CETIRIZINE HCL 10 MG PO TABS: 10.0000 mg | ORAL_TABLET | Freq: Two times a day (BID) | ORAL | 5 refills | Status: DC

## 2020-12-22 NOTE — Patient Instructions (Signed)
Alpha gal allergy Continue to avoid mammalian meats and mammalian products..  We have ordered a lab to help [name] evaluate your alpha gal allergy.  We will call you when the results becomes available.  He last had this lab drawn on 02/04/2020 In case of an allergic reaction, take Benadryl 50 mg every 4 hours, and if life-threatening symptoms occur, inject with EpiPen 0.3 mg.  Angioedema Take the least amount of medications while remaining symptom-free Cetirizine (Zyrtec) 10mg  twice a day and famotidine (Pepcid) 20 mg twice a day. If no symptoms for 7-14 days then decrease to. Cetirizine (Zyrtec) 10mg  twice a day and famotidine (Pepcid) 20 mg once a day.  If no symptoms for 7-14 days then decrease to. Cetirizine (Zyrtec) 10mg  twice a day.  If no symptoms for 7-14 days then decrease to. Cetirizine (Zyrtec) 10mg  once a day.  May use Benadryl (diphenhydramine) as needed for breakthrough hives       If symptoms return, then step up dosage  Keep a detailed symptom journal including foods eaten, contact with allergens, medications taken, weather changes.   Vertigo Follow-up with your primary care provider for evaluation and treatment of vertigo Continue meclizine 25 mg once or twice a day. This medication has a potential to make you sleepy. Do not drive or make any big decisions while taking this medication until you know how this medication will affect you  Call the clinic if this treatment plan is not working well for you  Follow up in 1 year or sooner if needed.

## 2020-12-22 NOTE — Progress Notes (Signed)
[place], [address] [place]  [id number] Dept: [phone]  FOLLOW UP NOTE  Patient ID: Kristen Kaiser, female    DOB: 1965/08/11  Age: 55 y.o. MRN: [id number] Date of Office Visit: 12/22/2020  Assessment  Chief Complaint: Follow-up, Dizziness, and Ear Pain (Left )  HPI Kristen Kaiser is a 55 year old female who presents the clinic for follow-up visit.  She was last seen in this clinic on 05/21/2020 by Dr. Ernst Bowler for evaluation of alpha gal allergy and angioedema.  At today's visit, she reports she has not had any incidences of angioedema since her last visit to this clinic.  She does continue cetirizine 10 mg once a day.  She continues to avoid mammalian meat and does report some diarrhea occurring with what she suspects is cross-contamination with mammalian meat.  She does occasionally eat cheese without issues.  She last had alpha gal panel drawn on 02/04/2020 with an alpha gal IgE 1.98.  She is interested in retesting this level at the 1 year mark.  She does take gabapentin in a caplet form at this time.  At today's visit she reports that for the last few days she has been experiencing vertigo which is worse at night when lying down.  She does report that her left ear is painful to touch in the ear canal.  She denies ear discharge and any change in hearing.  Her current medications are listed in the chart.   Drug Allergies:  Allergies  Allergen Reactions   Lisinopril Swelling    Physical Exam: BP 134/86 (BP Location: Left Arm, Patient Position: Sitting, Cuff Size: Normal)   Pulse 78   Temp (!) 97.3 F (36.3 C) (Temporal)   Resp 18   SpO2 98%    Physical Exam Vitals reviewed.  Constitutional:      Appearance: Normal appearance.  HENT:     Head: Normocephalic and atraumatic.     Right Ear: Tympanic membrane normal.     Left Ear: Tympanic membrane normal.     Nose:     Comments: Bilateral nares slightly erythematous with clear nasal drainage noted.  Pharynx normal.   Ears normal.  Eyes normal.    Mouth/Throat:     Pharynx: Oropharynx is clear.  Eyes:     Conjunctiva/sclera: Conjunctivae normal.  Cardiovascular:     Rate and Rhythm: Normal rate and regular rhythm.     Heart sounds: Normal heart sounds. No murmur heard. Pulmonary:     Effort: Pulmonary effort is normal.     Breath sounds: Normal breath sounds.     Comments: Lungs clear to auscultation Musculoskeletal:        General: Normal range of motion.     Cervical back: Normal range of motion and neck supple.  Skin:    General: Skin is warm and dry.  Neurological:     Mental Status: She is alert and oriented to person, place, and time.  Psychiatric:        Mood and Affect: Mood normal.        Behavior: Behavior normal.        Thought Content: Thought content normal.        Judgment: Judgment normal.    Assessment and Plan: 1. Allergic reaction to alpha-gal   2. Angioedema, subsequent encounter   3. Vertigo     Meds ordered this encounter  Medications   cetirizine (ZYRTEC ALLERGY) 10 MG tablet    Sig: Take 1 tablet (10 mg total) by  mouth 2 (two) times daily.    Dispense:  60 tablet    Refill:  5    Patient Instructions  Alpha gal allergy Continue to avoid mammalian meats and mammalian products..  We have ordered a lab to help [name] evaluate your alpha gal allergy.  We will call you when the results becomes available.  He last had this lab drawn on 02/04/2020 In case of an allergic reaction, take Benadryl 50 mg every 4 hours, and if life-threatening symptoms occur, inject with EpiPen 0.3 mg.  Angioedema Take the least amount of medications while remaining symptom-free Cetirizine (Zyrtec) 10mg  twice a day and famotidine (Pepcid) 20 mg twice a day. If no symptoms for 7-14 days then decrease to. Cetirizine (Zyrtec) 10mg  twice a day and famotidine (Pepcid) 20 mg once a day.  If no symptoms for 7-14 days then decrease to. Cetirizine (Zyrtec) 10mg  twice a day.  If no symptoms for 7-14  days then decrease to. Cetirizine (Zyrtec) 10mg  once a day.  May use Benadryl (diphenhydramine) as needed for breakthrough hives       If symptoms return, then step up dosage  Keep a detailed symptom journal including foods eaten, contact with allergens, medications taken, weather changes.   Vertigo Follow-up with your primary care provider for evaluation and treatment of vertigo Continue meclizine 25 mg once or twice a day. This medication has a potential to make you sleepy. Do not drive or make any big decisions while taking this medication until you know how this medication will affect you  Call the clinic if this treatment plan is not working well for you  Follow up in 1 year or sooner if needed.  Return in about 1 year (around 12/22/2021), or if symptoms worsen or fail to improve.    Thank you for the opportunity to care for this patient.  Please do not hesitate to contact me with questions.  Gareth Morgan, FNP [facility]

## 2021-01-02 ENCOUNTER — Telehealth: Payer: Self-pay | Admitting: Adult Health

## 2021-01-02 ENCOUNTER — Other Ambulatory Visit: Payer: Self-pay | Admitting: Allergy & Immunology

## 2021-01-02 NOTE — Telephone Encounter (Signed)
Demetrice called PCP too and has Rx for diflucan already, but will let me know if needs anything

## 2021-01-02 NOTE — Telephone Encounter (Signed)
Pt's allergy specialist gave her antibiotics for sinus infection, now she has a yeast infection.  Wonders if we could order something to treat the yeast infection?  (Last seen here 07/16/2019)  Please advise & notify pt    [facility]

## 2021-01-12 ENCOUNTER — Encounter (HOSPITAL_COMMUNITY): Payer: Self-pay

## 2021-01-12 NOTE — Progress Notes (Signed)
Patient called stating that she completed the tamoxifen in March and that she started her menstrual cycle 01/08/2021 and it has been very heavy. She states that she has not had a cycle for 5 years now. She states that she has appointment with GYN next Monday 01/19/2021. Patient wants to know if there is anything else she can do please advise [phone].  Dr. Tomie China response- Its not uncommon for people to start menstruation after discontinuing tamoxifen.  Keep the appointment with GYN.  Patient aware and agreeable with plan.

## 2021-01-13 ENCOUNTER — Other Ambulatory Visit: Payer: Self-pay

## 2021-01-13 ENCOUNTER — Ambulatory Visit (INDEPENDENT_AMBULATORY_CARE_PROVIDER_SITE_OTHER): Payer: 59 | Admitting: Adult Health

## 2021-01-13 ENCOUNTER — Encounter: Payer: Self-pay | Admitting: Adult Health

## 2021-01-13 VITALS — BP 145/92 | HR 81 | Ht 61.75 in | Wt 183.0 lb

## 2021-01-13 DIAGNOSIS — Z853 Personal history of malignant neoplasm of breast: Secondary | ICD-10-CM | POA: Insufficient documentation

## 2021-01-13 DIAGNOSIS — N950 Postmenopausal bleeding: Secondary | ICD-10-CM

## 2021-01-13 DIAGNOSIS — Z9229 Personal history of other drug therapy: Secondary | ICD-10-CM | POA: Diagnosis not present

## 2021-01-13 NOTE — Progress Notes (Signed)
  Subjective:     Patient ID: Kristen Kaiser, female   DOB: 1966-01-05, 55 y.o.   MRN: [id number]  HPI [name] is a 55 year old white female, married, PM, in for vaginal bleeding started 12/2020, she had breast cancer and finished tamoxifen in March. She had mucous discharge like Vaseline before the bleeding started, no odor. PCP is Dr Quillian Quince. Lab Results  Component Value Date   DIAGPAP  07/16/2019    - Negative for intraepithelial lesion or malignancy (NILM)   [place] Negative 07/16/2019    Review of Systems PMB Some cramping Reviewed past medical,surgical, social and family history. Reviewed medications and allergies.     Objective:   Physical Exam BP (!) 145/92 (BP Location: Left Arm, Patient Position: Sitting, Cuff Size: Normal)   Pulse 81   Ht 5' 1.75" (1.568 m)   Wt 183 lb (83 kg)   BMI 33.74 kg/m     Skin warm and dry.Pelvic: external genitalia is normal in appearance no lesions, vagina: +blood,urethra has no lesions or masses noted, cervix:smooth and bulbous, uterus: normal size, shape and contour, non tender, no masses felt, adnexa: no masses or tenderness noted. Bladder is non tender and no masses felt.    [place] - 01/12/21 1506       Contraception Wrap Up   End Method --   PM   Contraception Counseling Provided No            Examination chaperoned by Celene Squibb LPN  Assessment:     1. PMB (postmenopausal bleeding) [name] scheduled for [facility]/19/21 at [facility] at 3:30 pm to evaluate uterine lining. - US PELVIC COMPLETE WITH TRANSVAGINAL; Future Discussed if endometrium thickened will need biopsy to rule out uterine cancer   2. History of breast cancer  3. History of tamoxifen therapy  - US PELVIC COMPLETE WITH TRANSVAGINAL; Future     Plan:     Follow up prn Will talk when [name] resulted.

## 2021-01-19 ENCOUNTER — Other Ambulatory Visit: Payer: Self-pay

## 2021-01-19 ENCOUNTER — Telehealth: Payer: Self-pay | Admitting: Adult Health

## 2021-01-19 ENCOUNTER — Ambulatory Visit (HOSPITAL_COMMUNITY)
Admission: RE | Admit: 2021-01-19 | Discharge: 2021-01-19 | Disposition: A | Discharge status: Home / Self Care | Payer: 59 | Source: Ambulatory Visit | Attending: Adult Health | Admitting: Adult Health

## 2021-01-19 ENCOUNTER — Ambulatory Visit: Payer: 59 | Admitting: Adult Health

## 2021-01-19 DIAGNOSIS — Z9229 Personal history of other drug therapy: Secondary | ICD-10-CM | POA: Diagnosis present

## 2021-01-19 DIAGNOSIS — N950 Postmenopausal bleeding: Secondary | ICD-10-CM

## 2021-01-19 NOTE — Telephone Encounter (Signed)
Pt aware that US showed thickened endometrium, will get endometrial biopsy scheduled

## 2021-01-20 ENCOUNTER — Telehealth: Payer: Self-pay | Admitting: Adult Health

## 2021-01-20 NOTE — Telephone Encounter (Signed)
Pt had concerns over endometrial thickness, has biopsy scheduled for 01/29/21 with Dr Nelda Marseille just worried, but feels better now after talking.

## 2021-01-20 NOTE — Telephone Encounter (Signed)
Patient has questions about her results she wants to discuss with Anderson Malta.

## 2021-01-29 ENCOUNTER — Other Ambulatory Visit: Payer: Self-pay

## 2021-01-29 ENCOUNTER — Encounter: Payer: Self-pay | Admitting: Obstetrics & Gynecology

## 2021-01-29 ENCOUNTER — Other Ambulatory Visit (HOSPITAL_COMMUNITY)
Admission: RE | Admit: 2021-01-29 | Discharge: 2021-01-29 | Disposition: A | Discharge status: Home / Self Care | Payer: 59 | Source: Ambulatory Visit | Attending: Obstetrics & Gynecology | Admitting: Obstetrics & Gynecology

## 2021-01-29 ENCOUNTER — Ambulatory Visit (INDEPENDENT_AMBULATORY_CARE_PROVIDER_SITE_OTHER): Payer: 59 | Admitting: Obstetrics & Gynecology

## 2021-01-29 VITALS — BP 148/86 | HR 72 | Ht 62.0 in | Wt 179.0 lb

## 2021-01-29 DIAGNOSIS — N950 Postmenopausal bleeding: Secondary | ICD-10-CM

## 2021-01-29 DIAGNOSIS — R9389 Abnormal findings on diagnostic imaging of other specified body structures: Secondary | ICD-10-CM | POA: Diagnosis not present

## 2021-01-29 NOTE — Progress Notes (Signed)
GYN VISIT Patient name: Kristen Kaiser MRN [id number]  Date of birth: 1965-11-28 Chief Complaint:   Procedure (Endometrial Biopsy)  History of Present Illness:   Kristen Kaiser is a 55 y.o. [id number] PM female being seen today for follow up regarding:  Postmenopausal bleeding: Noted episode of vaginal bleeding- mid- October- lasted for 7 days, used about 3 pads per day. Kool-aid red, same amount the entire time.  +menstrual cramps and acne.  Today, she reports no acute complaints.  Seen by Electa Sniff at the time.  [name] completed 01/19/21: 11cm uterus with 31mm endometrium.  Normal left adnexa, right not visualized.   In review, Tamoxifen stopped in March.  Last period 5 yrs ago.   2016- diagnosed with breast Ca, stopped OCPs. S/p radiation had one menses 2017 then started Tamoxifen and no period since  No LMP recorded. Patient is postmenopausal.  Depression screen [facility] 2/9 07/16/2019  Decreased Interest 0  Down, Depressed, Hopeless 0  PHQ - 2 Score 0  Altered sleeping 0  Tired, decreased energy 1  Change in appetite 0  Feeling bad or failure about yourself  0  Trouble concentrating 0  Moving slowly or fidgety/restless 0  Suicidal thoughts 0  PHQ-9 Score 1  Difficult doing work/chores Not difficult at all     Review of Systems:   Pertinent items are noted in HPI Denies fever/chills, dizziness, headaches, visual disturbances, fatigue, shortness of breath, chest pain, abdominal pain, vomiting, bowel movements, urination, or intercourse unless otherwise stated above.  Pertinent History Reviewed:  Reviewed past medical,surgical, social, obstetrical and family history.  Reviewed problem list, medications and allergies. Physical Assessment:   Vitals:   01/29/21 1131  BP: (!) 148/86  Pulse: 72  Weight: 179 lb (81.2 kg)  Height: 5\' 2"  (1.575 m)  Body mass index is 32.74 kg/m.       Physical Examination:   General appearance: alert, well appearing, and in no distress  Psych: mood  appropriate, normal affect  Skin: warm & dry   Cardiovascular: normal heart rate noted  Respiratory: normal respiratory effort, no distress  Abdomen: soft, non-tender   Pelvic: VULVA: normal appearing vulva with no masses, tenderness or lesions, VAGINA: normal appearing vagina with normal color and discharge, no lesions, **LONG SPECULUM USED CERVIX: retroflexed at tilted to her right- very difficulty to visualize  Extremities: no edema   Chaperone: Celene Squibb    Endometrial Biopsy Procedure Note  Pre-operative Diagnosis: thickened endometrium, postmenopausal bleeding  Post-operative Diagnosis: same  Procedure Details  The risks (including infection, bleeding, pain, and uterine perforation) and benefits of the procedure were explained to the patient and Written informed consent was obtained.  Antibiotic prophylaxis against endocarditis was not indicated.   The patient was placed in the dorsal lithotomy position. Long speculum inserted.  Uterus was retroflexed and tilted to her left, cervix facing right anteiror wall- difficult to visualize.  Required some manipulation with tenaculum in order to be seen.  Cervix prepped with betadine.  Os finder was used to confirm entry into endometrial cavity.  A Pipelle endometrial aspirator was used to sample the endometrium.  Sample was sent for pathologic examination.  Condition: Stable  Complications: None   Assessment & Plan:  1) Thickened endometrium/Postmenopausal bleeding Reviewed US findings and discussed potential causes ranging from benign to endometrial can -Consent obtained for EMB today, next step pending results of pathology.   The patient was advised to call for any fever or for prolonged or severe pain  or bleeding. She was advised to use OTC analgesics as needed for mild to moderate pain. She was advised to avoid vaginal intercourse for 48 hours or until the bleeding has completely stopped.   Return if symptoms worsen or fail  to improve, for to be determined.   Janyth Pupa, DO Attending [place], [facility] for [employer], [place]

## 2021-01-30 LAB — SURGICAL PATHOLOGY

## 2021-02-02 ENCOUNTER — Telehealth: Payer: Self-pay | Admitting: Obstetrics & Gynecology

## 2021-02-02 NOTE — Telephone Encounter (Signed)
Patient called stating that she got a Mychart message over the weekend but she did not understand it, she thinks she was not even suppose tog et this message. Please contact the patient.

## 2021-02-02 NOTE — Telephone Encounter (Signed)
Returned pt's call. Two identifiers used. Pt had received biopsy results via Mychart and was unsure of what it meant. Reviewed results with pt and read Dr Annie Main note to her. Pt confirmed understanding.

## 2021-02-14 ENCOUNTER — Other Ambulatory Visit: Payer: Self-pay | Admitting: Allergy & Immunology

## 2021-03-19 ENCOUNTER — Encounter (HOSPITAL_COMMUNITY): Payer: Self-pay | Admitting: Hematology

## 2021-04-23 ENCOUNTER — Encounter (HOSPITAL_COMMUNITY): Payer: Self-pay | Admitting: Hematology

## 2021-04-30 ENCOUNTER — Inpatient Hospital Stay (HOSPITAL_COMMUNITY): Admission: RE | Admit: 2021-04-30 | Payer: Medicaid Other | Source: Ambulatory Visit

## 2021-05-03 ENCOUNTER — Other Ambulatory Visit (HOSPITAL_COMMUNITY): Payer: Self-pay | Admitting: Hematology

## 2021-05-04 ENCOUNTER — Encounter (HOSPITAL_COMMUNITY): Payer: Self-pay | Admitting: Hematology

## 2021-05-11 ENCOUNTER — Ambulatory Visit (HOSPITAL_COMMUNITY)
Admission: RE | Admit: 2021-05-11 | Discharge: 2021-05-11 | Disposition: A | Discharge status: Home / Self Care | Payer: Medicaid Other | Source: Ambulatory Visit | Attending: Hematology | Admitting: Hematology

## 2021-05-11 ENCOUNTER — Other Ambulatory Visit: Payer: Self-pay

## 2021-05-11 DIAGNOSIS — D0512 Intraductal carcinoma in situ of left breast: Secondary | ICD-10-CM | POA: Insufficient documentation

## 2021-05-11 DIAGNOSIS — Z1231 Encounter for screening mammogram for malignant neoplasm of breast: Secondary | ICD-10-CM | POA: Insufficient documentation

## 2021-05-14 ENCOUNTER — Ambulatory Visit (HOSPITAL_COMMUNITY): Payer: Medicaid Other

## 2021-06-18 ENCOUNTER — Encounter (HOSPITAL_COMMUNITY): Payer: Self-pay | Admitting: Hematology

## 2021-06-22 ENCOUNTER — Other Ambulatory Visit (HOSPITAL_COMMUNITY): Payer: Self-pay

## 2021-06-22 DIAGNOSIS — D0512 Intraductal carcinoma in situ of left breast: Secondary | ICD-10-CM

## 2021-06-22 DIAGNOSIS — E538 Deficiency of other specified B group vitamins: Secondary | ICD-10-CM

## 2021-06-23 ENCOUNTER — Inpatient Hospital Stay (HOSPITAL_COMMUNITY): Payer: 59 | Attending: Hematology

## 2021-06-23 DIAGNOSIS — Z86000 Personal history of in-situ neoplasm of breast: Secondary | ICD-10-CM | POA: Insufficient documentation

## 2021-06-23 DIAGNOSIS — Z79899 Other long term (current) drug therapy: Secondary | ICD-10-CM | POA: Diagnosis not present

## 2021-06-23 DIAGNOSIS — M8580 Other specified disorders of bone density and structure, unspecified site: Secondary | ICD-10-CM | POA: Diagnosis not present

## 2021-06-23 DIAGNOSIS — E538 Deficiency of other specified B group vitamins: Secondary | ICD-10-CM

## 2021-06-23 DIAGNOSIS — M62838 Other muscle spasm: Secondary | ICD-10-CM | POA: Diagnosis not present

## 2021-06-23 DIAGNOSIS — D0512 Intraductal carcinoma in situ of left breast: Secondary | ICD-10-CM

## 2021-06-23 DIAGNOSIS — R232 Flushing: Secondary | ICD-10-CM | POA: Diagnosis not present

## 2021-06-23 LAB — COMPREHENSIVE METABOLIC PANEL
ALT: 25 U/L (ref 0–44)
AST: 26 U/L (ref 15–41)
Albumin: 4.2 g/dL (ref 3.5–5.0)
Alkaline Phosphatase: 115 U/L (ref 38–126)
Anion gap: 10 (ref 5–15)
BUN: 10 mg/dL (ref 6–20)
CO2: 24 mmol/L (ref 22–32)
Calcium: 9 mg/dL (ref 8.9–10.3)
Chloride: 104 mmol/L (ref 98–111)
Creatinine, Ser: 0.64 mg/dL (ref 0.44–1.00)
GFR, Estimated: >60 mL/min (ref >60)
Glucose, Bld: 97 mg/dL (ref 70–99)
Potassium: 3.7 mmol/L (ref 3.5–5.1)
Sodium: 138 mmol/L (ref 135–145)
Total Bilirubin: 0.8 mg/dL (ref 0.3–1.2)
Total Protein: 7.6 g/dL (ref 6.5–8.1)

## 2021-06-23 LAB — CBC WITH DIFFERENTIAL/PLATELET
Abs Immature Granulocytes: 0.02 10*3/uL (ref 0.00–0.07)
Basophils Absolute: 0 10*3/uL (ref 0.0–0.1)
Basophils Relative: 1 %
Eosinophils Absolute: 0.1 10*3/uL (ref 0.0–0.5)
Eosinophils Relative: 1 %
HCT: 46 % (ref 36.0–46.0)
Hemoglobin: 15.7 g/dL — ABNORMAL HIGH (ref 12.0–15.0)
Immature Granulocytes: 0 %
Lymphocytes Relative: 26 %
Lymphs Abs: 1.3 10*3/uL (ref 0.7–4.0)
MCH: 30.1 pg (ref 26.0–34.0)
MCHC: 34.1 g/dL (ref 30.0–36.0)
MCV: 88.1 fL (ref 80.0–100.0)
Monocytes Absolute: 0.4 10*3/uL (ref 0.1–1.0)
Monocytes Relative: 8 %
Neutro Abs: 3.2 10*3/uL (ref 1.7–7.7)
Neutrophils Relative %: 64 %
Platelets: 226 10*3/uL (ref 150–400)
RBC: 5.22 MIL/uL — ABNORMAL HIGH (ref 3.87–5.11)
RDW: 13.2 % (ref 11.5–15.5)
WBC: 5 10*3/uL (ref 4.0–10.5)
nRBC: 0 % (ref 0.0–0.2)

## 2021-06-23 LAB — LACTATE DEHYDROGENASE: LDH: 175 U/L (ref 98–192)

## 2021-06-23 LAB — VITAMIN D 25 HYDROXY (VIT D DEFICIENCY, FRACTURES): Vit D, 25-Hydroxy: 26.11 ng/mL — ABNORMAL LOW (ref 30–100)

## 2021-06-23 LAB — VITAMIN B12: Vitamin B-12: 622 pg/mL (ref 180–914)

## 2021-06-29 ENCOUNTER — Encounter (HOSPITAL_COMMUNITY): Payer: Self-pay | Admitting: Hematology

## 2021-06-29 ENCOUNTER — Other Ambulatory Visit (HOSPITAL_COMMUNITY): Payer: Self-pay | Admitting: Hematology

## 2021-06-29 ENCOUNTER — Ambulatory Visit: Payer: 59 | Admitting: Orthopedic Surgery

## 2021-06-29 VITALS — BP 142/81 | HR 72 | Ht 62.0 in | Wt 179.0 lb

## 2021-06-29 DIAGNOSIS — G5603 Carpal tunnel syndrome, bilateral upper limbs: Secondary | ICD-10-CM

## 2021-06-29 DIAGNOSIS — D0512 Intraductal carcinoma in situ of left breast: Secondary | ICD-10-CM

## 2021-06-29 NOTE — Progress Notes (Signed)
Chief Complaint  ?Patient presents with  ? Carpal Tunnel  ?  Bilateral CTS.  ? ? 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  She has numbness and tingling both hands she feels that it involves all the fingers, there is some weakness she is dropping things she feels that the hands are swollen ? ?She says she has had symptoms on and off for over 15 years but worse in the last 1 year ? ?She was treated with nighttime bracing and ibuprofen and her primary care doctors gave her an injection of either Toradol or steroids and she fails that that may have been proved her situation but only slightly ? ?Past Medical History:  ?Diagnosis Date  ? Angio-edema   ? Breast cancer ([place]) 2016  ? COVID-19   ? Eczema   ? Hypertension   ? Personal history of radiation therapy 2016  ? Urticaria   ? ? ?BP (!) 142/81   Pulse 72   Ht '5\' 2"'$  (1.575 m)   Wt 179 lb (81.2 kg)   BMI 32.74 kg/m?  ? ? ?General appearance: Well-developed well-nourished no gross deformities ? ?Cardiovascular normal pulse and perfusion normal color without edema ? ?Neurologically no sensation loss or deficits or pathologic reflexes ? ?Psychological: Awake alert and oriented x3 mood and affect normal ? ?Skin no lacerations or ulcerations no nodularity no palpable masses, no erythema or nodularity ? ?Musculoskeletal:  ? ?Both hands show that they are swollen there is no atrophy in the thenar or hyperthenar eminence she has full range of motion wrist and fingers and the hand does look swollen ? ?She has tenderness over the carpal tunnel she has decreased sensation in the thumb and index finger. ? ?She has positive carpal tunnel compression test negative Tinel's positive Phalen's ? ? ? ?Imaging no imaging ? ?A/P ? 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 ?She is interested in injections  of the carpal tunnel and or endoscopic carpal tunnel release ? ?I therefore referred her to Dr. Tempie Donning to see if he could inject her and if not do endoscopic release and perhaps get her back to work sooner than a full open release ? ? ?

## 2021-06-30 ENCOUNTER — Inpatient Hospital Stay (HOSPITAL_BASED_OUTPATIENT_CLINIC_OR_DEPARTMENT_OTHER): Payer: 59 | Admitting: Hematology

## 2021-06-30 VITALS — BP 144/94 | HR 74 | Temp 98.0°F | Resp 18 | Ht 63.0 in | Wt 181.8 lb

## 2021-06-30 DIAGNOSIS — Z86000 Personal history of in-situ neoplasm of breast: Secondary | ICD-10-CM | POA: Diagnosis not present

## 2021-06-30 DIAGNOSIS — D0512 Intraductal carcinoma in situ of left breast: Secondary | ICD-10-CM

## 2021-06-30 MED ORDER — GABAPENTIN 100 MG PO CAPS: 100.0000 mg | ORAL_CAPSULE | Freq: Every day | ORAL | 6 refills | Status: DC

## 2021-06-30 NOTE — Progress Notes (Signed)
? ?[facility] ?[address] ?[place], [place] [address] ? ? ?Patient Care Team: ?Caryl Bis, MD as PCP - General (Family Medicine) ?Derek Jack, MD as Consulting Physician (Hematology) ? ?SUMMARY OF ONCOLOGIC HISTORY: ?Oncology History  ? No history exists.  ? ? ?CHIEF COMPLIANT: Follow-up for Ductal carcinoma in situ (DCIS) of left breast ? ? ?INTERVAL HISTORY: Ms. Kristen Kaiser is a 56 y.o. female here today for follow up of her DCIS of left breast. Her last visit was on 12/16/2020.  ? ?Today she reports feeling good. She reports carpal tunnel in her hands which causes joint pains and swellings. She continues to have hot flashes during the day which was not helped by black cohosh. She continues to take Gabapentin which she reports makes her sleepy. She has been taking HCTZ since she had a Covid infection in 2021. She is not currently taking vitamin D.  ? ?REVIEW OF SYSTEMS:   ?Review of Systems  ?Endocrine: Positive for hot flashes.  ?Musculoskeletal:  Positive for arthralgias (hands).  ?All other systems reviewed and are negative. ? ?I have reviewed the past medical history, past surgical history, social history and family history with the patient and they are unchanged from previous note. ? ? ?ALLERGIES:   ?is allergic to alpha-gal and lisinopril. ? ? ?MEDICATIONS:  ?Current Outpatient Medications  ?Medication Sig Dispense Refill  ? gabapentin (NEURONTIN) 100 MG capsule Take 1 capsule (100 mg total) by mouth daily. 30 capsule 6  ? cetirizine (ZYRTEC) 10 MG tablet TAKE 1 TABLET BY MOUTH TWICE A DAY 180 tablet 1  ? citalopram (CELEXA) 40 MG tablet TAKE 1 TABLET BY MOUTH EVERY DAY 90 tablet 4  ? EPINEPHrine 0.3 mg/0.3 mL IJ SOAJ injection SMARTSIG:0.3 Milliliter(s) IM Once PRN    ? famotidine (PEPCID) 20 MG tablet Take 1 tablet (20 mg total) by mouth 2 (two) times daily. 60 tablet 5  ? gabapentin (NEURONTIN) 300 MG capsule TAKE 1 CAPSULE BY MOUTH EVERYDAY AT BEDTIME 30 capsule 5  ?  hydrochlorothiazide (HYDRODIURIL) 12.5 MG tablet Take 12.5 mg by mouth daily.    ? ?No current facility-administered medications for this visit.  ? ? ? ?PHYSICAL EXAMINATION: ?Performance status (ECOG): 1 - Symptomatic but completely ambulatory ? ?Vitals:  ? 06/30/21 1157  ?BP: (!) 144/94  ?Pulse: 74  ?Resp: 18  ?Temp: 98 ?F (36.7 ?C)  ?SpO2: 96%  ? ?Wt Readings from Last 3 Encounters:  ?06/30/21 181 lb 12.8 oz (82.5 kg)  ?06/29/21 179 lb (81.2 kg)  ?01/29/21 179 lb (81.2 kg)  ? ?Physical Exam ?Vitals reviewed.  ?Constitutional:   ?   Appearance: Normal appearance. She is obese.  ?Cardiovascular:  ?   Rate and Rhythm: Normal rate and regular rhythm.  ?   Pulses: Normal pulses.  ?   Heart sounds: Normal heart sounds.  ?Pulmonary:  ?   Effort: Pulmonary effort is normal.  ?   Breath sounds: Normal breath sounds.  ?Neurological:  ?   General: No focal deficit present.  ?   Mental Status: She is alert and oriented to person, place, and time.  ?Psychiatric:     ?   Mood and Affect: Mood normal.     ?   Behavior: Behavior normal.  ? ? ?Breast Exam Chaperone: Thana Ates   ? ? ?LABORATORY DATA:  ?I have reviewed the data as listed ? ?  Latest Ref Rng & Units 06/23/2021  ? 11:12 AM 04/24/2020  ?  9:40 AM 03/18/2020  ? 10:54 AM  ?CMP  ?Glucose 70 - 99 mg/dL 97   80   78    ?BUN 6 - 20 mg/dL '10   12   13    '$ ?Creatinine 0.44 - 1.00 mg/dL 0.64   0.68   0.70    ?Sodium 135 - 145 mmol/L 138   136   140    ?Potassium 3.5 - 5.1 mmol/L 3.7   4.1   4.1    ?Chloride 98 - 111 mmol/L 104   102   102    ?CO2 22 - 32 mmol/L '24   29   25    '$ ?Calcium 8.9 - 10.3 mg/dL 9.0   9.0   9.4    ?Total Protein 6.5 - 8.1 g/dL 7.6   6.6   6.6    ?Total Bilirubin 0.3 - 1.2 mg/dL 0.8   0.7   <0.2    ?Alkaline Phos 38 - 126 U/L 115   76   98    ?AST 15 - 41 U/L '26   22   17    '$ ?ALT 0 - 44 U/L '25   18   14    '$ ? ?No results found for: RXV400 ?Lab Results  ?Component Value Date  ? WBC 5.0 06/23/2021  ? HGB 15.7 (H) 06/23/2021  ? HCT 46.0 06/23/2021  ? MCV 88.1  06/23/2021  ? PLT 226 06/23/2021  ? NEUTROABS 3.2 06/23/2021  ? ? ?ASSESSMENT:  ?1.  High-grade DCIS of the left breast: ?-Status post lumpectomy on 01/27/2015 with pathology showing DCIS, high-grade with calcifications. ?-Status post radiation therapy and tamoxifen started in March 2017. ?-Tamoxifen from March 2017 through March 2022. ?  ?2.  Hot flashes: ?-She is continuing Celexa 20 mg daily and gabapentin 300 mg at bedtime. ?-She has on and off hot flashes. ?  ?3.  Osteopenia: ?-DEXA scan on 04/24/2019 reviewed by me shows T score of -1.2. ?-She will continue vitamin D supplements.  She will continue weightbearing exercises.  We will plan to repeat DEXA scan in 2 to 3 years. ?  ?PLAN:  ?1.  High-grade DCIS of the left breast: ?- Tamoxifen was discontinued in March 2022. ?- Reviewed mammogram from 05/11/2021.  Was BI-RADS Category 1. ?- Reviewed labs today which showed normal LFTs and CBC with mildly elevated hemoglobin. ?- Recommend follow-up in 1 year with mammogram and labs. ?  ?  ?2.  Hot flashes: ?- She takes gabapentin 300 mg at bedtime which is helping.  It also controls with her muscle spasms. ?- She is taking Celexa 40 mg daily.  She has hot flashes during daytime.  She tried black cohosh which has not helped. ?- We will give her gabapentin 100 mg in the mornings as 300 mg dose causes drowsiness. ?  ?  ?3.  Osteopenia: ?- Labs from 06/23/2021 with vitamin D 26. ?- She will start vitamin D 1000 units daily. ?- Plan to repeat vitamin D in 1 year. ? ?Breast Cancer therapy associated bone loss: I have recommended calcium, Vitamin D and weight bearing exercises. ? ?Orders placed this encounter:  ?No orders of the defined types were placed in this encounter. ? ? ?The patient has a good understanding of the overall plan. She agrees with it. She will call with any problems that may develop before the next visit here. ? ?Derek Jack, MD ?[facility] ?[phone] ? ? ?I, Thana Ates, am acting as  a Education administrator  for Dr. Derek Jack. ? ?I, Derek Jack MD, have reviewed the above documentation for accuracy and completeness, and I agree with the above. ?  ?  ?

## 2021-06-30 NOTE — Patient Instructions (Signed)
[facility] at [facility] ?Discharge Instructions ? ? ?You were seen and examined today by Dr. Delton Coombes. ? ?He reviewed your lab work which is normal/stable with the exception of your Vitamin D.  It is low.  You should take Vitamin D 1000 units daily.  ? ?We sent a prescription for Neurontin in a smaller dose that you can try to take during the day to see if it helps with your hot flashes.  ? ?Return as scheduled in 1 year.  ? ? ?Thank you for choosing [facility] at [facility] to provide your oncology and hematology care.  To afford each patient quality time with our provider, please arrive at least 15 minutes before your scheduled appointment time.  ? ?If you have a lab appointment with the [facility] please come in thru the Main Entrance and check in at the main information desk. ? ?You need to re-schedule your appointment should you arrive 10 or more minutes late.  We strive to give you quality time with our providers, and arriving late affects you and other patients whose appointments are after yours.  Also, if you no show three or more times for appointments you may be dismissed from the clinic at the providers discretion.     ?Again, thank you for choosing [facility].  Our hope is that these requests will decrease the amount of time that you wait before being seen by our physicians.       ?_____________________________________________________________ ? ?Should you have questions after your visit to [facility], please contact our office at [phone] and follow the prompts.  Our office hours are 8:00 a.m. and 4:30 p.m. Monday - Friday.  Please note that voicemails left after 4:00 p.m. may not be returned until the following business day.  We are closed weekends and major holidays.  You do have access to a nurse 24-7, just call the main number to the clinic [phone] and do not press any options, hold on the line and a nurse  will answer the phone.   ? ?For prescription refill requests, have your pharmacy contact our office and allow 72 hours.   ? ?Due to Covid, you will need to wear a mask upon entering the hospital. If you do not have a mask, a mask will be given to you at the Main Entrance upon arrival. For doctor visits, patients may have 1 support person age 35 or older with them. For treatment visits, patients can not have anyone with them due to social distancing guidelines and our immunocompromised population.  ? ?   ?

## 2021-07-09 ENCOUNTER — Ambulatory Visit: Payer: 59 | Admitting: Orthopedic Surgery

## 2021-07-10 ENCOUNTER — Encounter: Payer: Self-pay | Admitting: Orthopedic Surgery

## 2021-07-10 ENCOUNTER — Ambulatory Visit (INDEPENDENT_AMBULATORY_CARE_PROVIDER_SITE_OTHER): Payer: 59 | Admitting: Orthopedic Surgery

## 2021-07-10 DIAGNOSIS — G5603 Carpal tunnel syndrome, bilateral upper limbs: Secondary | ICD-10-CM

## 2021-07-10 DIAGNOSIS — R200 Anesthesia of skin: Secondary | ICD-10-CM | POA: Diagnosis not present

## 2021-07-10 MED ORDER — BETAMETHASONE SOD PHOS & ACET 6 (3-3) MG/ML IJ SUSP
6.0000 mg | INTRAMUSCULAR | Status: AC | PRN
  Administered 2021-07-10: 6 mg via INTRA_ARTICULAR

## 2021-07-10 MED ORDER — LIDOCAINE HCL 1 % IJ SOLN
1.0000 mL | INTRAMUSCULAR | Status: AC | PRN
Start: 2021-07-10 — End: 2021-07-10
  Administered 2021-07-10: 1 mL

## 2021-07-10 NOTE — Progress Notes (Signed)
? ?Office Visit Note ?  ?Patient: Kristen Kaiser           ?Date of Birth: Sep 01, 1965           ?MRN: [id number] ?Visit Date: 07/10/2021 ?             ?Requested by: Carole Civil, MD ?[address] ?[place],  [place] [address] ?PCP: Caryl Bis, MD ? ? ?Assessment & Plan: ?Visit Diagnoses:  ?1. Bilateral hand numbness   ? ? ?Plan: Discussed with patient that her history and exam findings are consistent with bilateral carpal tunnel syndrome.  We discussed the nature of carpal tunnel syndrome as well as the diagnosis, prognosis and both conservative and surgical treatment options.  After discussion, the patient like to proceed with bilateral corticosteroid injections into the carpal tunnel.  We discussed the risks and benefits of corticosteroid injection.  I can see her back in the office in 6-8 weeks if the injections fail to relieve her symptoms or when her symptoms return.  ? ?Follow-Up Instructions: No follow-ups on file.  ? ?Orders:  ?No orders of the defined types were placed in this encounter. ? ?No orders of the defined types were placed in this encounter. ? ? ? ? Procedures: ?Hand/UE Inj: R carpal tunnel for carpal tunnel syndrome on 07/10/2021 4:59 PM ?Indications: therapeutic ?Details: 25 G needle, volar approach ?Medications: 1 mL lidocaine 1 %; 6 mg betamethasone acetate-betamethasone sodium phosphate 6 (3-3) MG/ML ?Procedure, treatment alternatives, risks and benefits explained, specific risks discussed. Consent was given by the patient. Immediately prior to procedure a time out was called to verify the correct patient, procedure, equipment, support staff and site/side marked as required. Patient was prepped and draped in the usual sterile fashion.  ? ? ?Hand/UE Inj: L carpal tunnel for carpal tunnel syndrome on 07/10/2021 5:00 PM ?Indications: therapeutic ?Details: 25 G needle, volar approach ?Medications: 1 mL lidocaine 1 %; 6 mg betamethasone acetate-betamethasone sodium phosphate 6 (3-3)  MG/ML ?Procedure, treatment alternatives, risks and benefits explained, specific risks discussed. Consent was given by the patient. Immediately prior to procedure a time out was called to verify the correct patient, procedure, equipment, support staff and site/side marked as required. Patient was prepped and draped in the usual sterile fashion.  ? ? ? ? ?Clinical Data: ?No additional findings. ? ? ?Subjective: ?Chief Complaint  ?Patient presents with  ? Right Hand - Numbness, Weakness  ?  RIGHT handed, Pain: 7-8/10 at night, shakes hands to wake up, +n/t, weakness, Swelling, can't wear rings, works as a Emergency planning/management officer, last 2-3 weeks has been bad, using braces, ibuprofen, had injections over a month ago, tried [employer] relaxers  ? Left Hand - Numbness, Weakness  ? ? 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  Is been going on for many years now but has worsened over the last month or so.  She describes numbness and tingling involving the thumb, index, middle, and ring finger.  Small finger is never involved.  She was previously having nocturnal symptoms nearly every night but has been sleeping with night extension braces.  This is nearly resolved her nighttime symptoms.  She still has numbness and tingling in the previously mentioned fingers during the day.  This is worse when she is working.  She notes that she will occasionally drop objects that she is holding.  She notes that while she is getting here she will occasionally to stop and  shake her hands for symptom relief.  She is taken ibuprofen with modest symptom improvement.  She is never had electrodiagnostic study.  She has no history of diabetes, hypothyroidism, wrist trauma, cervical spine issue, or inflammatory neuropathy. ? ?Weakness ?Associated symptoms include weakness.  ? ?Review of Systems  ?Neurological:  Positive for weakness.  ? ? ?Objective: ?Vital Signs: Ht '5\' 2"'$  (1.575 m)    Wt 179 lb (81.2 kg)   BMI 32.74 kg/m?  ? ?Physical Exam ?Constitutional:   ?   Appearance: Normal appearance.  ?Cardiovascular:  ?   Rate and Rhythm: Normal rate.  ?   Pulses: Normal pulses.  ?Skin: ?   General: Skin is warm and dry.  ?   Capillary Refill: Capillary refill takes less than 2 seconds.  ?Neurological:  ?   Mental Status: She is alert.  ? ? ?Right Hand Exam  ? ?Tenderness  ?The patient is experiencing no tenderness.  ? ?Range of Motion  ?The patient has normal right wrist ROM.  ? ?Other  ?Erythema: absent ?Sensation: normal ?Pulse: present ? ?Comments:  Positive Tinel, Phalen, and Durkan signs.  5/5 thenar motor strength.  ? ? ?Left Hand Exam  ? ?Tenderness  ?The patient is experiencing no tenderness.  ? ?Range of Motion  ?The patient has normal left wrist ROM. ? ?Other  ?Erythema: absent ?Sensation: normal ?Pulse: present ? ?Comments:  Positive Tinel, Phalen, and Durkan signs.  5/5 thenar motor strength.  ? ? ? ? ?Specialty Comments:  ?No specialty comments available. ? ?Imaging: ?No results found. ? ? ?PMFS History: ?Patient Active Problem List  ? Diagnosis Date Noted  ? Bilateral hand numbness 07/10/2021  ? History of tamoxifen therapy 01/13/2021  ? History of breast cancer 01/13/2021  ? PMB (postmenopausal bleeding) 01/13/2021  ? B12 deficiency 04/29/2020  ? Angio-edema 03/18/2020  ? Rash and other nonspecific skin eruption 03/18/2020  ? Pruritus 03/18/2020  ? Adverse reaction to food, subsequent encounter 03/18/2020  ? Ductal carcinoma in situ (DCIS) of left breast 12/18/2015  ? ?Past Medical History:  ?Diagnosis Date  ? Angio-edema   ? Breast cancer ([place]) 2016  ? COVID-19   ? Eczema   ? Hypertension   ? Personal history of radiation therapy 2016  ? Urticaria   ?  ?Family History  ?Problem Relation Age of Onset  ? Cancer Paternal Grandfather   ? Asthma Paternal Grandfather   ? Alzheimer's disease Paternal Grandmother   ? Congestive Heart Failure Maternal Grandmother   ? Cancer Maternal Grandfather    ?     rectal  ? Atrial fibrillation Father   ? Atrial fibrillation Mother   ? Hypertension Brother   ? Hypertension Sister   ? Allergic rhinitis Neg Hx   ? Eczema Neg Hx   ? Urticaria Neg Hx   ?  ?Past Surgical History:  ?Procedure Laterality Date  ? BREAST LUMPECTOMY Left 2016  ? CESAREAN SECTION    ? ?Social History  ? ?Occupational History  ? Not on file  ?Tobacco Use  ? Smoking status: Former  ?  Packs/day: 1.00  ?  Years: 15.00  ?  Pack years: 15.00  ?  Types: Cigarettes  ?  Quit date: 12/18/1998  ?  Years since quitting: 22.5  ? Smokeless tobacco: Never  ?Vaping Use  ? Vaping Use: Never used  ?Substance and Sexual Activity  ? Alcohol use: Yes  ?  Comment: occ  ? Drug use: Never  ? Sexual activity: Yes  ?  Birth control/protection: Post-menopausal  ? ? ? ? ? ? ?

## 2021-07-20 ENCOUNTER — Ambulatory Visit: Payer: 59 | Admitting: Orthopedic Surgery

## 2021-08-21 ENCOUNTER — Ambulatory Visit: Payer: 59 | Admitting: Orthopedic Surgery

## 2021-08-28 ENCOUNTER — Ambulatory Visit: Payer: 59 | Admitting: Orthopedic Surgery

## 2021-09-21 ENCOUNTER — Telehealth: Payer: Self-pay | Admitting: Orthopedic Surgery

## 2021-09-21 NOTE — Telephone Encounter (Signed)
Patient Is requesting cortisone injections in both hands she is leaving out of town for a week from the 15th-22nd and is wanting to be seen before the 15th please advise

## 2021-09-23 NOTE — Telephone Encounter (Signed)
I called and offered 10/01/21 @ 1:15, she could not do that time, so she has been scheduled for 10/13/21 @ 10:45am

## 2021-10-13 ENCOUNTER — Ambulatory Visit: Payer: 59 | Admitting: Orthopedic Surgery

## 2021-10-16 ENCOUNTER — Ambulatory Visit: Payer: 59 | Admitting: Orthopedic Surgery

## 2021-10-19 ENCOUNTER — Ambulatory Visit: Payer: 59 | Admitting: Orthopedic Surgery

## 2021-10-20 ENCOUNTER — Ambulatory Visit: Payer: 59 | Admitting: Orthopedic Surgery

## 2021-11-10 ENCOUNTER — Other Ambulatory Visit (HOSPITAL_COMMUNITY): Payer: Self-pay | Admitting: Hematology

## 2021-11-13 ENCOUNTER — Encounter: Payer: Self-pay | Admitting: Orthopedic Surgery

## 2021-11-13 ENCOUNTER — Ambulatory Visit: Payer: BC Managed Care – PPO | Admitting: Orthopedic Surgery

## 2021-11-13 DIAGNOSIS — R200 Anesthesia of skin: Secondary | ICD-10-CM

## 2021-11-13 DIAGNOSIS — G5601 Carpal tunnel syndrome, right upper limb: Secondary | ICD-10-CM | POA: Diagnosis not present

## 2021-11-13 DIAGNOSIS — G5602 Carpal tunnel syndrome, left upper limb: Secondary | ICD-10-CM

## 2021-11-13 MED ORDER — LIDOCAINE HCL 1 % IJ SOLN
1.0000 mL | INTRAMUSCULAR | Status: AC | PRN
  Administered 2021-11-13: 1 mL

## 2021-11-13 MED ORDER — LIDOCAINE HCL 1 % IJ SOLN
1.0000 mL | INTRAMUSCULAR | Status: AC | PRN
Start: 2021-11-13 — End: 2021-11-13
  Administered 2021-11-13: 1 mL

## 2021-11-13 MED ORDER — BETAMETHASONE SOD PHOS & ACET 6 (3-3) MG/ML IJ SUSP
6.0000 mg | INTRAMUSCULAR | Status: AC | PRN
  Administered 2021-11-13: 6 mg via INTRA_ARTICULAR

## 2021-11-13 NOTE — Progress Notes (Signed)
Office Visit Note   Patient: Kristen Kaiser           Date of Birth: 04/13/1965           MRN: [id number] Visit Date: 11/13/2021              Requested by: Caryl Bis, MD [place],  [place] [address] PCP: Caryl Bis, MD   Assessment & Plan: Visit Diagnoses:  1. Bilateral hand numbness     Plan: Patient presents with bilateral carpal tunnel syndrome.  She was last seen in April at which time she underwent bilateral corticosteroid junction of the carpal tunnels.  She had significant relief for some time but her symptoms have recurred.  She describes numbness and paresthesias in just the median nerve distribution.  On the right side she seems to have diminished thenar motor function with limited opposition and abduction.  We again discussed the nature of carpal tunnel syndrome and both conservative and surgical treatment options.  She is not interested in surgery at this point.  She like to try another injection.  I can see him back again as needed.  Follow-Up Instructions: No follow-ups on file.   Orders:  No orders of the defined types were placed in this encounter.  No orders of the defined types were placed in this encounter.     Procedures: Hand/UE Inj: R carpal tunnel for carpal tunnel syndrome on 11/13/2021 6:15 PM Indications: therapeutic Details: 25 G needle, volar approach Medications: 1 mL lidocaine 1 %; 6 mg betamethasone acetate-betamethasone sodium phosphate 6 (3-3) MG/ML Procedure, treatment alternatives, risks and benefits explained, specific risks discussed. Consent was given by the patient. Immediately prior to procedure a time out was called to verify the correct patient, procedure, equipment, support staff and site/side marked as required. Patient was prepped and draped in the usual sterile fashion.    Hand/UE Inj: L carpal tunnel for carpal tunnel syndrome on 11/13/2021 6:15 PM Indications: therapeutic Details: 25 G needle, volar  approach Medications: 1 mL lidocaine 1 %; 6 mg betamethasone acetate-betamethasone sodium phosphate 6 (3-3) MG/ML Procedure, treatment alternatives, risks and benefits explained, specific risks discussed. Consent was given by the patient. Immediately prior to procedure a time out was called to verify the correct patient, procedure, equipment, support staff and site/side marked as required. Patient was prepped and draped in the usual sterile fashion.       Clinical Data: No additional findings.   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  She was last seen in April at which time she was having numbness involving the thumb, index, middle, and ring fingers.  The small finger is never involved.  She underwent corticosteroid injection in the both wrist at that time.  She had significant symptom relief for several months now.  Her symptoms have returned.  She sleeps with night braces at night but still has nocturnal symptoms.    Review of Systems   Objective: Vital Signs: There were no vitals taken for this visit.  Physical Exam  Right Hand Exam   Tenderness  The patient is experiencing no tenderness.   Other  Erythema: absent Sensation: decreased Pulse: present  Comments:  Positive Tinel, Phalen, Durkan signs.  Limited thenar motor function with limited opposition or abduction.  Left Hand Exam   Tenderness  The patient is experiencing no tenderness.   Other  Erythema: absent Sensation: decreased Pulse: present  Comments:  Positive Tinel, Phalen, Durkan signs. 4/5 thenar motor function.       Specialty Comments:  No specialty comments available.  Imaging: No results found.   PMFS History: Patient Active Problem List   Diagnosis Date Noted   Bilateral hand numbness 07/10/2021    History of tamoxifen therapy 01/13/2021   History of breast cancer 01/13/2021   PMB (postmenopausal bleeding) 01/13/2021   B12 deficiency 04/29/2020   Angio-edema 03/18/2020   Rash and other nonspecific skin eruption 03/18/2020   Pruritus 03/18/2020   Adverse reaction to food, subsequent encounter 03/18/2020   Ductal carcinoma in situ (DCIS) of left breast 12/18/2015   Past Medical History:  Diagnosis Date   Angio-edema    Breast cancer ([place]) 2016   COVID-19    Eczema    Hypertension    Personal history of radiation therapy 2016   Urticaria     Family History  Problem Relation Age of Onset   Cancer Paternal Grandfather    Asthma Paternal Grandfather    Alzheimer's disease Paternal Grandmother    Congestive Heart Failure Maternal Grandmother    Cancer Maternal Grandfather        rectal   Atrial fibrillation Father    Atrial fibrillation Mother    Hypertension Brother    Hypertension Sister    Allergic rhinitis Neg Hx    Eczema Neg Hx    Urticaria Neg Hx     Past Surgical History:  Procedure Laterality Date   BREAST LUMPECTOMY Left 2016   CESAREAN SECTION     Social History   Occupational History   Not on file  Tobacco Use   Smoking status: Former    Packs/day: 1.00    Years: 15.00    Total pack years: 15.00    Types: Cigarettes    Quit date: 12/18/1998    Years since quitting: 22.9   Smokeless tobacco: Never  Vaping Use   Vaping Use: Never used  Substance and Sexual Activity   Alcohol use: Yes    Comment: occ   Drug use: Never   Sexual activity: Yes    Birth control/protection: Post-menopausal

## 2021-11-17 ENCOUNTER — Other Ambulatory Visit: Payer: Self-pay

## 2021-11-17 MED ORDER — GABAPENTIN 600 MG PO TABS: 300.0000 mg | ORAL_TABLET | Freq: Every day | ORAL | 5 refills | Status: DC

## 2021-11-17 NOTE — Telephone Encounter (Signed)
Call received from patient stating that the gabapentin '300mg'$  capsules are causing flares for her Alpha Gal due to them being in gel capsules. Patient requests '600mg'$  tablet prescription for her to split at home so that she can take '300mg'$  daily. Order placed for '600mg'$  half tab to be taken QHS per Dr. Delton Coombes

## 2021-11-18 ENCOUNTER — Telehealth: Payer: Self-pay

## 2021-11-18 ENCOUNTER — Encounter (HOSPITAL_COMMUNITY): Payer: Self-pay | Admitting: Hematology

## 2021-11-18 NOTE — Telephone Encounter (Signed)
Patient called stating that she has numbness in both hands and in her toes.  Stated that fingers have a tingling feeling.  Patient had injection in both wrist on Friday, 11/13/2021.  Would like a call back to discuss.  Cb# [phone].  Please advise.  Thank you.

## 2021-11-19 ENCOUNTER — Ambulatory Visit: Payer: BC Managed Care – PPO | Admitting: Adult Health

## 2021-12-07 ENCOUNTER — Other Ambulatory Visit: Payer: Self-pay | Admitting: Family Medicine

## 2022-03-03 IMAGING — MG MM DIGITAL SCREENING BILAT W/ TOMO AND CAD
8 series · 9 of 24 positions shown · non-contrast
Comparison: Previous exam(s).

CLINICAL DATA: Screening.

EXAM:
DIGITAL SCREENING BILATERAL MAMMOGRAM WITH TOMOSYNTHESIS AND CAD
TECHNIQUE: Bilateral screening digital craniocaudal and mediolateral oblique
mammograms were obtained. Bilateral screening digital breast
tomosynthesis was performed. The images were evaluated with
computer-aided detection.

[L CC synth-2D]
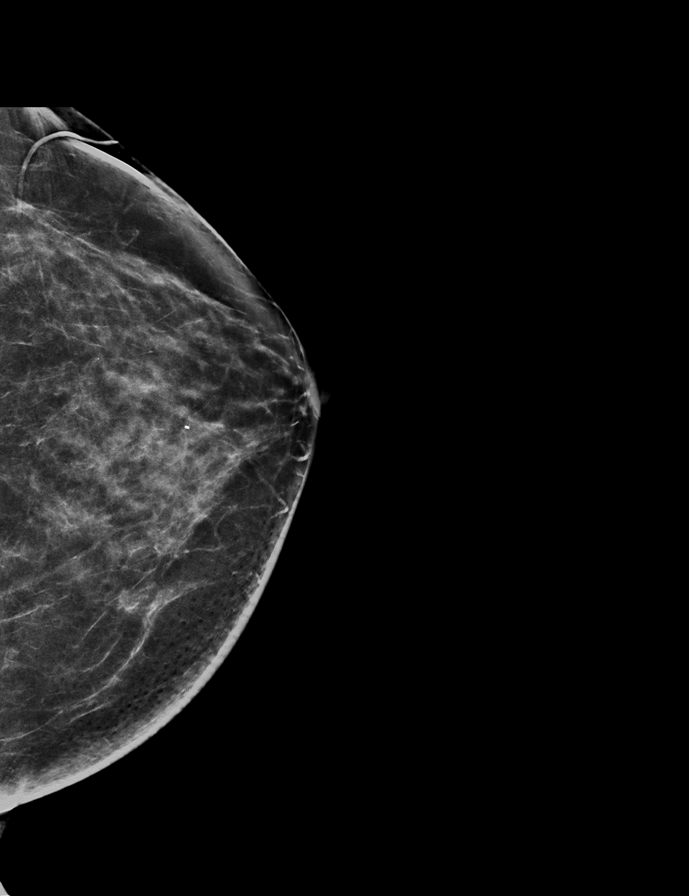

[R MLO synth-2D]
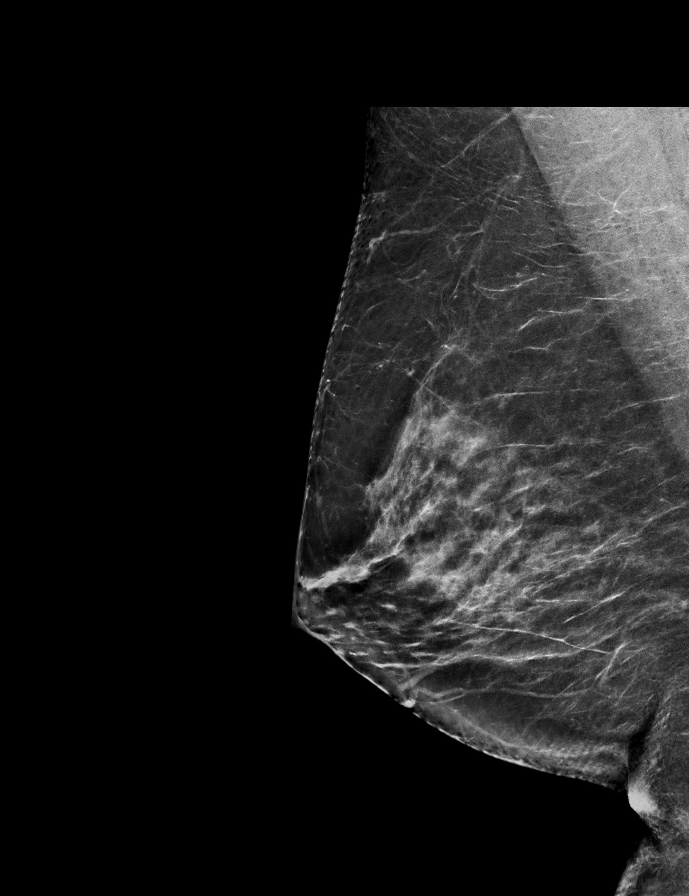

[L MLO synth-2D]
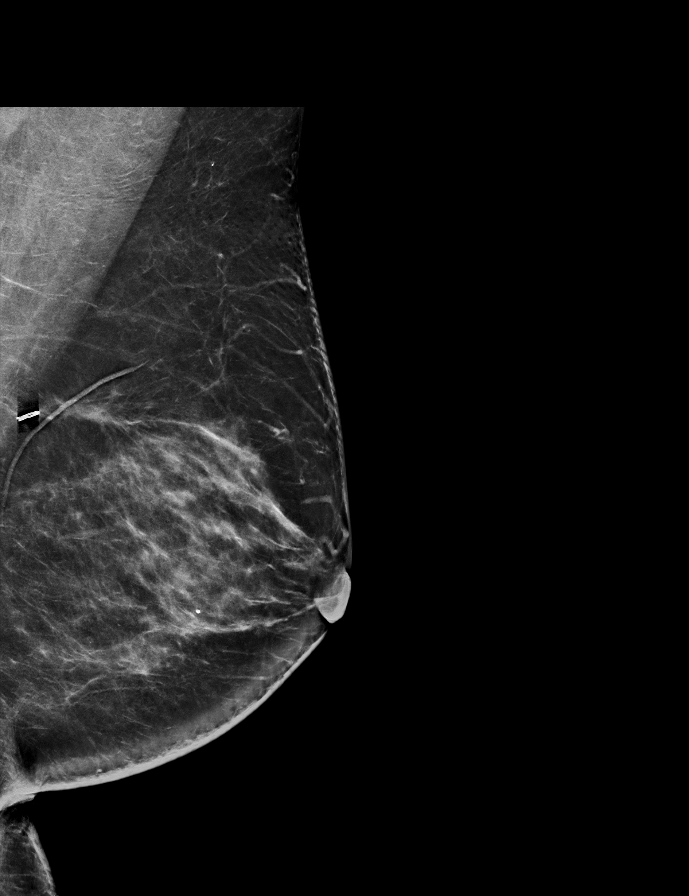

[R CC synth-2D]
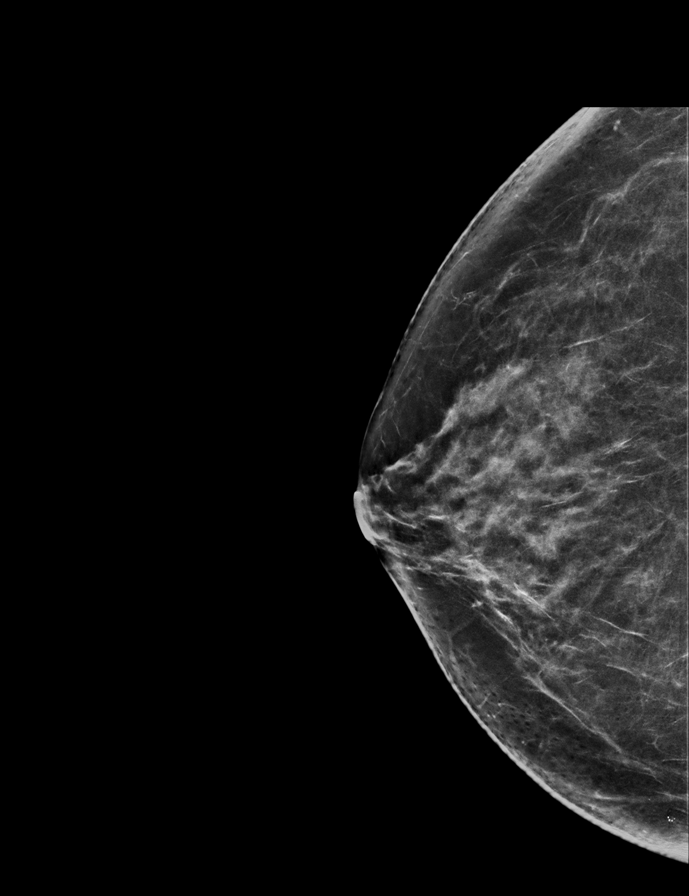

[L CC tomo · 2 of 71 frames shown]
[frame 23/71]
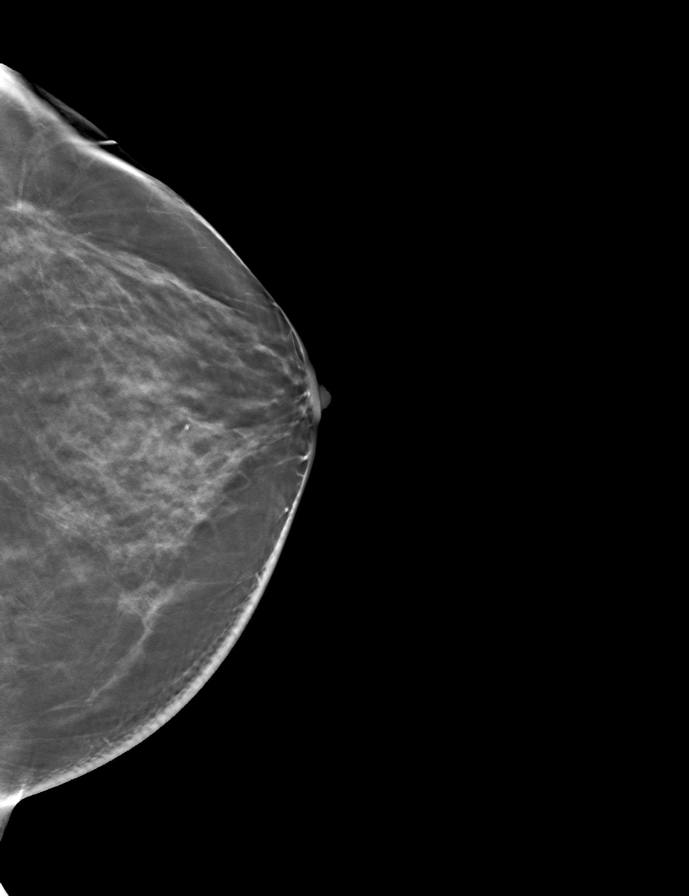
[frame 36/71]
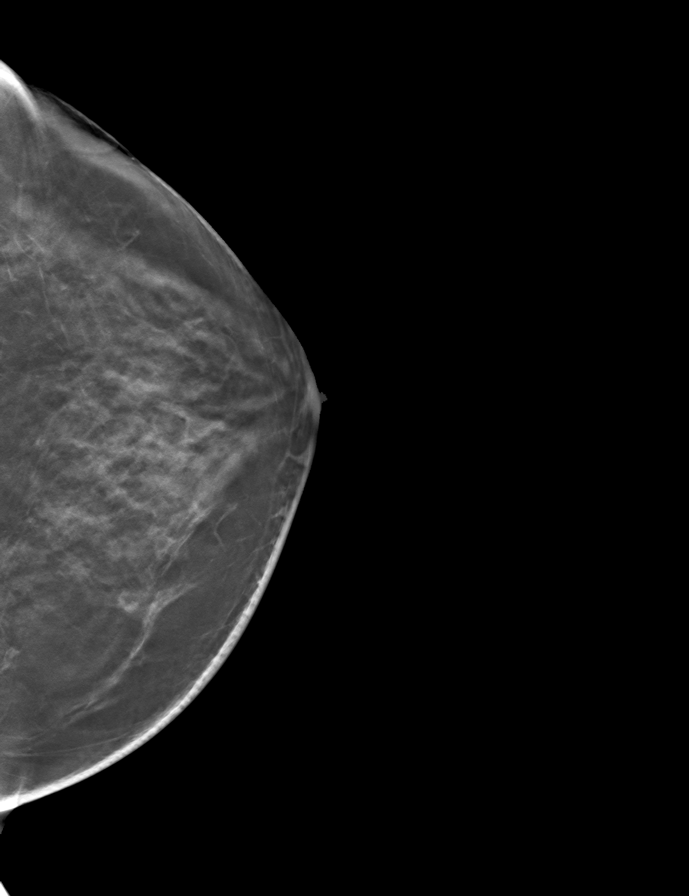

[L MLO tomo · tomo slice 39/77.0]
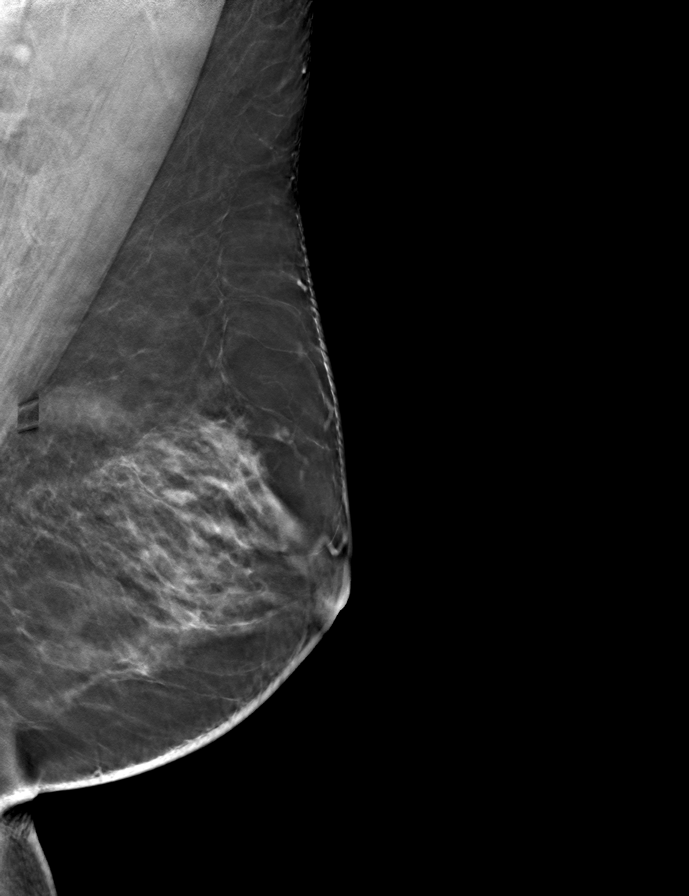

[R MLO tomo · tomo slice 38/75.0]
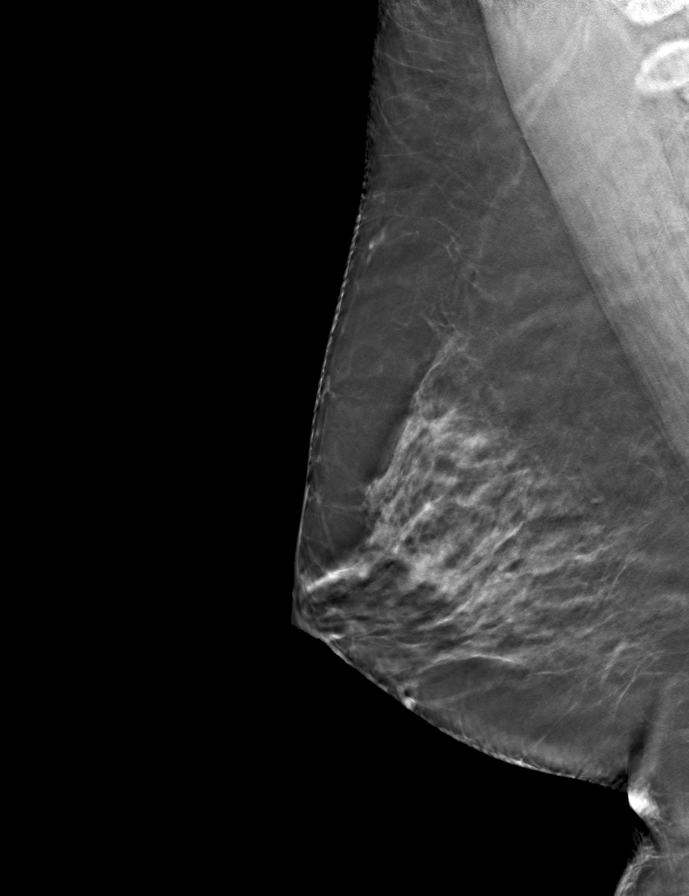

[R CC tomo · tomo slice 36/71.0]
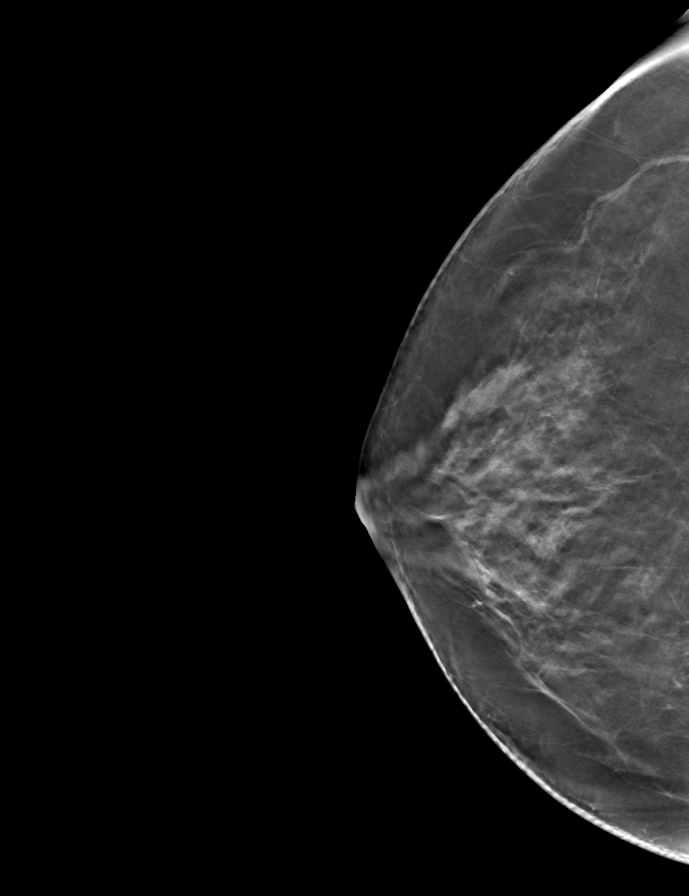

[9 of 24 positions shown; findings below may reference images not displayed]

ACR Breast Density Category c: The breast tissue is heterogeneously
dense, which may obscure small masses.
FINDINGS: There are no findings suspicious for malignancy.
IMPRESSION: No mammographic evidence of malignancy. A result letter of this
screening mammogram will be mailed directly to the patient.

RECOMMENDATION:
Screening mammogram in one year. (Code:Q3-W-BC3)

BI-RADS CATEGORY  1: Negative.

## 2022-05-17 ENCOUNTER — Ambulatory Visit (HOSPITAL_COMMUNITY): Payer: BC Managed Care – PPO

## 2022-05-17 ENCOUNTER — Other Ambulatory Visit: Payer: Self-pay | Admitting: Family Medicine

## 2022-05-20 ENCOUNTER — Encounter: Payer: Self-pay | Admitting: Radiology

## 2022-05-26 ENCOUNTER — Ambulatory Visit (HOSPITAL_COMMUNITY): Payer: BC Managed Care – PPO

## 2022-05-26 ENCOUNTER — Encounter (HOSPITAL_COMMUNITY): Payer: Self-pay

## 2022-06-21 ENCOUNTER — Inpatient Hospital Stay: Payer: BC Managed Care – PPO | Attending: Hematology

## 2022-06-21 DIAGNOSIS — R232 Flushing: Secondary | ICD-10-CM | POA: Diagnosis not present

## 2022-06-21 DIAGNOSIS — Z79899 Other long term (current) drug therapy: Secondary | ICD-10-CM | POA: Insufficient documentation

## 2022-06-21 DIAGNOSIS — E876 Hypokalemia: Secondary | ICD-10-CM | POA: Diagnosis not present

## 2022-06-21 DIAGNOSIS — M8580 Other specified disorders of bone density and structure, unspecified site: Secondary | ICD-10-CM | POA: Diagnosis not present

## 2022-06-21 DIAGNOSIS — Z86000 Personal history of in-situ neoplasm of breast: Secondary | ICD-10-CM | POA: Insufficient documentation

## 2022-06-21 DIAGNOSIS — D0512 Intraductal carcinoma in situ of left breast: Secondary | ICD-10-CM

## 2022-06-21 DIAGNOSIS — Z923 Personal history of irradiation: Secondary | ICD-10-CM | POA: Diagnosis not present

## 2022-06-21 DIAGNOSIS — Z87891 Personal history of nicotine dependence: Secondary | ICD-10-CM | POA: Insufficient documentation

## 2022-06-21 LAB — VITAMIN D 25 HYDROXY (VIT D DEFICIENCY, FRACTURES): Vit D, 25-Hydroxy: 46.22 ng/mL (ref 30–100)

## 2022-06-21 LAB — CBC WITH DIFFERENTIAL/PLATELET
Abs Immature Granulocytes: 0.01 10*3/uL (ref 0.00–0.07)
Basophils Absolute: 0 10*3/uL (ref 0.0–0.1)
Basophils Relative: 1 %
Eosinophils Absolute: 0.1 10*3/uL (ref 0.0–0.5)
Eosinophils Relative: 2 %
HCT: 41.9 % (ref 36.0–46.0)
Hemoglobin: 14.5 g/dL (ref 12.0–15.0)
Immature Granulocytes: 0 %
Lymphocytes Relative: 29 %
Lymphs Abs: 1.6 10*3/uL (ref 0.7–4.0)
MCH: 30.3 pg (ref 26.0–34.0)
MCHC: 34.6 g/dL (ref 30.0–36.0)
MCV: 87.5 fL (ref 80.0–100.0)
Monocytes Absolute: 0.3 10*3/uL (ref 0.1–1.0)
Monocytes Relative: 6 %
Neutro Abs: 3.3 10*3/uL (ref 1.7–7.7)
Neutrophils Relative %: 62 %
Platelets: 235 10*3/uL (ref 150–400)
RBC: 4.79 MIL/uL (ref 3.87–5.11)
RDW: 12.9 % (ref 11.5–15.5)
WBC: 5.3 10*3/uL (ref 4.0–10.5)
nRBC: 0 % (ref 0.0–0.2)

## 2022-06-21 LAB — VITAMIN B12: Vitamin B-12: 383 pg/mL (ref 180–914)

## 2022-06-21 LAB — COMPREHENSIVE METABOLIC PANEL
ALT: 18 U/L (ref 0–44)
AST: 20 U/L (ref 15–41)
Albumin: 3.9 g/dL (ref 3.5–5.0)
Alkaline Phosphatase: 110 U/L (ref 38–126)
Anion gap: 8 (ref 5–15)
BUN: 13 mg/dL (ref 6–20)
CO2: 23 mmol/L (ref 22–32)
Calcium: 9.1 mg/dL (ref 8.9–10.3)
Chloride: 104 mmol/L (ref 98–111)
Creatinine, Ser: 0.78 mg/dL (ref 0.44–1.00)
GFR, Estimated: >60 mL/min (ref >60)
Glucose, Bld: 126 mg/dL — ABNORMAL HIGH (ref 70–99)
Potassium: 3.2 mmol/L — ABNORMAL LOW (ref 3.5–5.1)
Sodium: 135 mmol/L (ref 135–145)
Total Bilirubin: 0.7 mg/dL (ref 0.3–1.2)
Total Protein: 7 g/dL (ref 6.5–8.1)

## 2022-06-21 LAB — LACTATE DEHYDROGENASE: LDH: 148 U/L (ref 98–192)

## 2022-06-22 ENCOUNTER — Other Ambulatory Visit: Payer: Self-pay

## 2022-06-28 NOTE — Progress Notes (Signed)
[facility] [address] [place], [place] [id number]    Clinic Day:  06/29/2022  Referring physician: [name], [name], MD  Patient Care Team: [name], [name], MD as PCP - General (Family Medicine) [name], [name], MD as Consulting Physician (Hematology)   ASSESSMENT & PLAN:   Assessment: 1.  High-grade DCIS of the left breast: -Status post lumpectomy on 01/27/2015 with pathology showing DCIS, high-grade with calcifications. - XRT in March 2017. -Tamoxifen from March 2017 through March 2022.   2.  Osteopenia: -DEXA scan on 04/24/2019 reviewed by me shows T score of -1.2.  Plan: 1.  High-grade DCIS of the left breast: - She reportedly had mammogram at [facility] and was told to be normal. - I will obtain mammogram results and review them. - Physical exam today did not reveal any palpable adenopathy. - Labs from 06/21/2022: Normal LFTs with mild hypokalemia.  CBC was normal.  B12 was normal. - RTC   2.  Hot flashes: - She is taking gabapentin 100 mg in the mornings and 300 mg at bedtime.  She is also taking Celexa 40 mg daily.  She continues to have hot flashes particularly during daytime. - I have recommended Veozah 45 mg daily.  We discussed side effects in detail.   3.  Osteopenia: - Continue vitamin D 1000 units daily.  Vitamin D level is normal at 46.  Orders Placed This Encounter  Procedures   MM 3D SCREENING MAMMOGRAM BILATERAL BREAST    NAS No implants No medical devices Sch'd appt with office on 06/29/22-kjones    Standing Status:   Future    Standing Expiration Date:   06/29/2023    Order Specific Question:   Reason for Exam (SYMPTOM  OR DIAGNOSIS REQUIRED)    Answer:   breast cancer screening    Order Specific Question:   Is the patient pregnant?    Answer:   No    Order Specific Question:   Preferred imaging location?    Answer:   [facility]      I,[name],acting as a scribe for Doreatha MassedSreedhar Besan Ketchem, MD.,have  documented all relevant documentation on the behalf of Doreatha MassedSreedhar Shani Fitch, MD,as directed by  [name], MD while in the presence of [name], MD.   I, [name] MD, have reviewed the above documentation for accuracy and completeness, and I agree with the above.   [name], MD   4/9/202412:52 PM  CHIEF COMPLAINT:   Diagnosis: DCIS of left breast   Cancer Staging  No matching staging information was found for the patient.   Prior Therapy: tamoxifen 05/2015 through 05/2020  Current Therapy:  surveillance   HISTORY OF PRESENT ILLNESS:   Oncology History   No history exists.     INTERVAL HISTORY:   Kristen Kaiser is a 57 y.o. female presenting to clinic today for follow up of DCIS of left breast. She was last seen by me on 06/30/21.  She has not yet undergone annual mammogram this year, last 05/11/21.  Today, she states that she is doing well overall. Her appetite level is at 100%. Her energy level is at 50%.  PAST MEDICAL HISTORY:   Past Medical History: Past Medical History:  Diagnosis Date   Angio-edema    Breast cancer (HCC) 2016   COVID-19    Eczema    Hypertension    Personal history of radiation therapy 2016   Urticaria     Surgical History: Past Surgical History:  Procedure Laterality Date   BREAST LUMPECTOMY Left 2016   CESAREAN SECTION      Social History: Social History   Socioeconomic History   Marital status: Married    Spouse name: Not on file   Number of children: Not on file   Years of education: Not on file   Highest education level: Not on file  Occupational History   Not on file  Tobacco Use   Smoking status: Former    Packs/day: 1.00    Years: 15.00    Additional pack years: 0.00    Total pack years: 15.00    Types: Cigarettes    Quit date: 12/18/1998    Years since quitting: 23.5   Smokeless tobacco: Never  Vaping Use   Vaping Use: Never used  Substance and Sexual Activity   Alcohol use: Yes     Comment: occ   Drug use: Never   Sexual activity: Yes    Birth control/protection: Post-menopausal  Other Topics Concern   Not on file  Social History Narrative   Not on file   Social Determinants of Health   Financial Resource Strain: Low Risk  (07/16/2019)   Overall Financial Resource Strain (CARDIA)    Difficulty of Paying Living Expenses: Not hard at all  Food Insecurity: No Food Insecurity (07/16/2019)   Hunger Vital Sign    Worried About Running Out of Food in the Last Year: Never true    Ran Out of Food in the Last Year: Never true  Transportation Needs: No Transportation Needs (07/16/2019)   PRAPARE - Administrator, Civil Service (Medical): No    Lack of Transportation (Non-Medical): No  Physical Activity: Inactive (07/16/2019)   Exercise Vital Sign    Days of Exercise per Week: 0 days    Minutes of Exercise per Session: 0 min  Stress: No Stress Concern Present (07/16/2019)   [name]    Feeling of Stress : Only a little  Social Connections: Moderately Integrated (07/16/2019)   Social Connection and Isolation Panel [NHANES]    Frequency of Communication with Friends and Family: More than three times a week    Frequency of Social Gatherings with Friends and Family: Once a week    Attends Religious Services: More than 4 times per year    Active Member of [employer] or Organizations: No    Attends Banker Meetings: Never    Marital Status: Married  Catering manager Violence: Not At Risk (07/16/2019)   Humiliation, Afraid, Rape, and Kick questionnaire    Fear of Current or Ex-Partner: No    Emotionally Abused: No    Physically Abused: No    Sexually Abused: No    Family History: Family History  Problem Relation Age of Onset   Cancer Paternal Grandfather    Asthma Paternal Grandfather    Alzheimer's disease Paternal Grandmother    Congestive Heart Failure Maternal Grandmother     Cancer Maternal Grandfather        rectal   Atrial fibrillation Father    Atrial fibrillation Mother    Hypertension Brother    Hypertension Sister    Allergic rhinitis Neg Hx    Eczema Neg Hx    Urticaria Neg Hx     Current Medications:  Current Outpatient Medications:    cetirizine (ZYRTEC) 10 MG tablet, TAKE 1 TABLET BY MOUTH TWICE A DAY, Disp: 180 tablet, Rfl: 1   citalopram (CELEXA) 40 MG  tablet, TAKE 1 TABLET BY MOUTH EVERY DAY, Disp: 90 tablet, Rfl: 4   EPINEPHrine 0.3 mg/0.3 mL IJ SOAJ injection, SMARTSIG:0.3 Milliliter(s) IM Once PRN, Disp: , Rfl:    famotidine (PEPCID) 20 MG tablet, Take 1 tablet (20 mg total) by mouth 2 (two) times daily., Disp: 60 tablet, Rfl: 5   Fezolinetant (VEOZAH) 45 MG TABS, Take 1 tablet (45 mg total) by mouth daily., Disp: 90 tablet, Rfl: 4   gabapentin (NEURONTIN) 600 MG tablet, Take 0.5 tablets (300 mg total) by mouth at bedtime., Disp: 45 tablet, Rfl: 5   hydrochlorothiazide (HYDRODIURIL) 12.5 MG tablet, Take 12.5 mg by mouth daily., Disp: , Rfl:    Allergies: Allergies  Allergen Reactions   Alpha-Gal    Lisinopril Swelling    REVIEW OF SYSTEMS:   Review of Systems  Constitutional:  Negative for chills, fatigue and fever.  HENT:   Negative for lump/mass, mouth sores, nosebleeds, sore throat and trouble swallowing.   Eyes:  Negative for eye problems.  Respiratory:  Negative for cough and shortness of breath.   Cardiovascular:  Negative for chest pain, leg swelling and palpitations.  Gastrointestinal:  Negative for abdominal pain, constipation, diarrhea, nausea and vomiting.  Endocrine: Positive for hot flashes.  Genitourinary:  Negative for bladder incontinence, difficulty urinating, dysuria, frequency, hematuria and nocturia.   Musculoskeletal:  Negative for arthralgias, back pain, flank pain, myalgias and neck pain.  Skin:  Negative for itching and rash.  Neurological:  Negative for dizziness, headaches and numbness.  Hematological:   Does not bruise/bleed easily.  Psychiatric/Behavioral:  Negative for depression, sleep disturbance and suicidal ideas. The patient is not nervous/anxious.   All other systems reviewed and are negative.    VITALS:   Blood pressure (!) 136/94, pulse (!) 3, temperature 98.2 F (36.8 C), temperature source Oral, resp. rate 16, weight 179 lb 14.4 oz (81.6 kg), SpO2 97 %.  Wt Readings from Last 3 Encounters:  06/29/22 179 lb 14.4 oz (81.6 kg)  07/10/21 179 lb (81.2 kg)  06/30/21 181 lb 12.8 oz (82.5 kg)    Body mass index is 32.9 kg/m.  Performance status (ECOG): 0 - Asymptomatic  PHYSICAL EXAM:   Physical Exam Vitals and nursing note reviewed. Exam conducted with a chaperone present.  Constitutional:      Appearance: Normal appearance.  Cardiovascular:     Rate and Rhythm: Normal rate and regular rhythm.     Pulses: Normal pulses.     Heart sounds: Normal heart sounds.  Pulmonary:     Effort: Pulmonary effort is normal.     Breath sounds: Normal breath sounds.  Abdominal:     Palpations: Abdomen is soft. There is no hepatomegaly, splenomegaly or mass.     Tenderness: There is no abdominal tenderness.  Musculoskeletal:     Right lower leg: No edema.     Left lower leg: No edema.  Lymphadenopathy:     Cervical: No cervical adenopathy.     Right cervical: No superficial, deep or posterior cervical adenopathy.    Left cervical: No superficial, deep or posterior cervical adenopathy.     Upper Body:     Right upper body: No supraclavicular or axillary adenopathy.     Left upper body: No supraclavicular or axillary adenopathy.  Neurological:     General: No focal deficit present.     Mental Status: She is alert and oriented to person, place, and time.  Psychiatric:        Mood and Affect:  Mood normal.        Behavior: Behavior normal.     LABS:      Latest Ref Rng & Units 06/21/2022    3:22 PM 06/23/2021   11:12 AM 04/24/2020    9:40 AM  CBC  WBC 4.0 - 10.5 K/uL 5.3  5.0   4.7   Hemoglobin 12.0 - 15.0 g/dL 06.3  01.6  01.0   Hematocrit 36.0 - 46.0 % 41.9  46.0  42.4   Platelets 150 - 400 K/uL 235  226  243       Latest Ref Rng & Units 06/21/2022    3:22 PM 06/23/2021   11:12 AM 04/24/2020    9:40 AM  CMP  Glucose 70 - 99 mg/dL 932  97  80   BUN 6 - 20 mg/dL 13  10  12    Creatinine 0.44 - 1.00 mg/dL 3.55  7.32  2.02   Sodium 135 - 145 mmol/L 135  138  136   Potassium 3.5 - 5.1 mmol/L 3.2  3.7  4.1   Chloride 98 - 111 mmol/L 104  104  102   CO2 22 - 32 mmol/L 23  24  29    Calcium 8.9 - 10.3 mg/dL 9.1  9.0  9.0   Total Protein 6.5 - 8.1 g/dL 7.0  7.6  6.6   Total Bilirubin 0.3 - 1.2 mg/dL 0.7  0.8  0.7   Alkaline Phos 38 - 126 U/L 110  115  76   AST 15 - 41 U/L 20  26  22    ALT 0 - 44 U/L 18  25  18       No results found for: "CEA1", "CEA" / No results found for: "CEA1", "CEA" No results found for: "PSA1" No results found for: "RKY706" No results found for: "CAN125"  No results found for: "TOTALPROTELP", "ALBUMINELP", "A1GS", "A2GS", "BETS", "BETA2SER", "GAMS", "MSPIKE", "SPEI" No results found for: "TIBC", "FERRITIN", "IRONPCTSAT" Lab Results  Component Value Date   LDH 148 06/21/2022   LDH 175 06/23/2021   LDH 172 04/24/2020     STUDIES:   No results found.

## 2022-06-29 ENCOUNTER — Inpatient Hospital Stay: Payer: BC Managed Care – PPO | Admitting: Hematology

## 2022-06-29 VITALS — BP 136/94 | HR 3 | Temp 98.2°F | Resp 16 | Wt 179.9 lb

## 2022-06-29 DIAGNOSIS — D0512 Intraductal carcinoma in situ of left breast: Secondary | ICD-10-CM

## 2022-06-29 DIAGNOSIS — Z86000 Personal history of in-situ neoplasm of breast: Secondary | ICD-10-CM | POA: Diagnosis not present

## 2022-06-29 MED ORDER — VEOZAH 45 MG PO TABS: 45.0000 mg | ORAL_TABLET | Freq: Every day | ORAL | 4 refills | Status: DC

## 2022-06-29 NOTE — Patient Instructions (Addendum)
[facility] at [facility] Discharge Instructions   You were seen and examined today by Dr. Ellin Saba.  He reviewed the results of your lab work which are normal.  We will obtain your mammogram results from [facility].   We will see you back in one year. We will repeat lab work and mammogram prior to your next visit.    Thank you for choosing [facility] at [facility] to provide your oncology and hematology care.  To afford each patient quality time with our provider, please arrive at least 15 minutes before your scheduled appointment time.   If you have a lab appointment with the Cancer Center please come in thru the Main Entrance and check in at the main information desk.  You need to re-schedule your appointment should you arrive 10 or more minutes late.  We strive to give you quality time with our providers, and arriving late affects you and other patients whose appointments are after yours.  Also, if you no show three or more times for appointments you may be dismissed from the clinic at the providers discretion.     Again, thank you for choosing [facility].  Our hope is that these requests will decrease the amount of time that you wait before being seen by our physicians.       _____________________________________________________________  Should you have questions after your visit to [facility], please contact our office at [phone] and follow the prompts.  Our office hours are 8:00 a.m. and 4:30 p.m. Monday - Friday.  Please note that voicemails left after 4:00 p.m. may not be returned until the following business day.  We are closed weekends and major holidays.  You do have access to a nurse 24-7, just call the main number to the clinic [phone] and do not press any options, hold on the line and a nurse will answer the phone.    For prescription refill requests, have your pharmacy contact our office and  allow 72 hours.    Due to Covid, you will need to wear a mask upon entering the hospital. If you do not have a mask, a mask will be given to you at the Main Entrance upon arrival. For doctor visits, patients may have 1 support person age 90 or older with them. For treatment visits, patients can not have anyone with them due to social distancing guidelines and our immunocompromised population.

## 2022-07-03 ENCOUNTER — Other Ambulatory Visit: Payer: Self-pay | Admitting: Hematology

## 2022-07-03 DIAGNOSIS — D0512 Intraductal carcinoma in situ of left breast: Secondary | ICD-10-CM

## 2022-07-05 ENCOUNTER — Encounter (HOSPITAL_COMMUNITY): Payer: Self-pay | Admitting: Hematology

## 2022-08-31 ENCOUNTER — Other Ambulatory Visit: Payer: Self-pay | Admitting: *Deleted

## 2022-08-31 MED ORDER — GABAPENTIN 300 MG PO CAPS: 300.0000 mg | ORAL_CAPSULE | Freq: Every day | ORAL | 3 refills | Status: DC

## 2022-09-28 ENCOUNTER — Other Ambulatory Visit: Payer: Self-pay | Admitting: *Deleted

## 2022-09-28 MED ORDER — GABAPENTIN 600 MG PO TABS: 300.0000 mg | ORAL_TABLET | Freq: Every day | ORAL | 0 refills | Status: DC

## 2022-12-28 ENCOUNTER — Other Ambulatory Visit: Payer: Self-pay | Admitting: Hematology

## 2023-05-16 ENCOUNTER — Encounter (HOSPITAL_COMMUNITY): Payer: Self-pay | Admitting: Hematology

## 2023-05-16 ENCOUNTER — Ambulatory Visit (HOSPITAL_COMMUNITY): Payer: BC Managed Care – PPO

## 2023-05-19 ENCOUNTER — Encounter (HOSPITAL_COMMUNITY): Payer: Self-pay

## 2023-05-19 ENCOUNTER — Ambulatory Visit (HOSPITAL_COMMUNITY)
Admission: RE | Admit: 2023-05-19 | Discharge: 2023-05-19 | Disposition: A | Discharge status: Home / Self Care | Payer: BC Managed Care – PPO | Source: Ambulatory Visit | Attending: Hematology | Admitting: Hematology

## 2023-05-19 DIAGNOSIS — D0512 Intraductal carcinoma in situ of left breast: Secondary | ICD-10-CM | POA: Diagnosis present

## 2023-05-19 DIAGNOSIS — Z1231 Encounter for screening mammogram for malignant neoplasm of breast: Secondary | ICD-10-CM | POA: Insufficient documentation

## 2023-06-27 ENCOUNTER — Other Ambulatory Visit: Payer: Self-pay

## 2023-06-27 ENCOUNTER — Inpatient Hospital Stay: Payer: BC Managed Care – PPO

## 2023-06-27 DIAGNOSIS — D0512 Intraductal carcinoma in situ of left breast: Secondary | ICD-10-CM

## 2023-06-28 ENCOUNTER — Inpatient Hospital Stay: Payer: BC Managed Care – PPO | Attending: Hematology

## 2023-06-28 DIAGNOSIS — R42 Dizziness and giddiness: Secondary | ICD-10-CM | POA: Diagnosis not present

## 2023-06-28 DIAGNOSIS — Z86000 Personal history of in-situ neoplasm of breast: Secondary | ICD-10-CM | POA: Insufficient documentation

## 2023-06-28 DIAGNOSIS — Z87891 Personal history of nicotine dependence: Secondary | ICD-10-CM | POA: Diagnosis not present

## 2023-06-28 DIAGNOSIS — D0512 Intraductal carcinoma in situ of left breast: Secondary | ICD-10-CM

## 2023-06-28 LAB — CBC WITH DIFFERENTIAL/PLATELET
Abs Immature Granulocytes: 0.01 10*3/uL (ref 0.00–0.07)
Basophils Absolute: 0 10*3/uL (ref 0.0–0.1)
Basophils Relative: 1 %
Eosinophils Absolute: 0.1 10*3/uL (ref 0.0–0.5)
Eosinophils Relative: 2 %
HCT: 43.7 % (ref 36.0–46.0)
Hemoglobin: 14.8 g/dL (ref 12.0–15.0)
Immature Granulocytes: 0 %
Lymphocytes Relative: 25 %
Lymphs Abs: 1.2 10*3/uL (ref 0.7–4.0)
MCH: 30.5 pg (ref 26.0–34.0)
MCHC: 33.9 g/dL (ref 30.0–36.0)
MCV: 89.9 fL (ref 80.0–100.0)
Monocytes Absolute: 0.5 10*3/uL (ref 0.1–1.0)
Monocytes Relative: 10 %
Neutro Abs: 3 10*3/uL (ref 1.7–7.7)
Neutrophils Relative %: 62 %
Platelets: 258 10*3/uL (ref 150–400)
RBC: 4.86 MIL/uL (ref 3.87–5.11)
RDW: 13.3 % (ref 11.5–15.5)
WBC: 4.8 10*3/uL (ref 4.0–10.5)
nRBC: 0 % (ref 0.0–0.2)

## 2023-06-28 LAB — COMPREHENSIVE METABOLIC PANEL WITH GFR
ALT: 20 U/L (ref 0–44)
AST: 23 U/L (ref 15–41)
Albumin: 4 g/dL (ref 3.5–5.0)
Alkaline Phosphatase: 111 U/L (ref 38–126)
Anion gap: 10 (ref 5–15)
BUN: 14 mg/dL (ref 6–20)
CO2: 25 mmol/L (ref 22–32)
Calcium: 9.4 mg/dL (ref 8.9–10.3)
Chloride: 101 mmol/L (ref 98–111)
Creatinine, Ser: 0.76 mg/dL (ref 0.44–1.00)
GFR, Estimated: >60 mL/min (ref >60)
Glucose, Bld: 89 mg/dL (ref 70–99)
Potassium: 4.3 mmol/L (ref 3.5–5.1)
Sodium: 136 mmol/L (ref 135–145)
Total Bilirubin: 0.8 mg/dL (ref 0.0–1.2)
Total Protein: 7.5 g/dL (ref 6.5–8.1)

## 2023-06-28 LAB — VITAMIN B12: Vitamin B-12: 327 pg/mL (ref 180–914)

## 2023-06-28 LAB — VITAMIN D 25 HYDROXY (VIT D DEFICIENCY, FRACTURES): Vit D, 25-Hydroxy: 35.92 ng/mL (ref 30–100)

## 2023-06-28 LAB — LACTATE DEHYDROGENASE: LDH: 139 U/L (ref 98–192)

## 2023-07-04 ENCOUNTER — Ambulatory Visit: Payer: BC Managed Care – PPO | Admitting: Hematology

## 2023-07-05 ENCOUNTER — Inpatient Hospital Stay (HOSPITAL_BASED_OUTPATIENT_CLINIC_OR_DEPARTMENT_OTHER): Payer: BC Managed Care – PPO | Admitting: Hematology

## 2023-07-05 VITALS — BP 103/78 | HR 65 | Temp 97.9°F | Resp 18 | Ht 62.0 in | Wt 171.0 lb

## 2023-07-05 DIAGNOSIS — Z86000 Personal history of in-situ neoplasm of breast: Secondary | ICD-10-CM | POA: Diagnosis not present

## 2023-07-05 DIAGNOSIS — D0512 Intraductal carcinoma in situ of left breast: Secondary | ICD-10-CM | POA: Diagnosis not present

## 2023-07-05 NOTE — Progress Notes (Signed)
 [facility] [address] [place] [address]    Clinic Day:  07/05/2023  Referring physician: Richardean Chimera, MD  Patient Care Team: Richardean Chimera, MD as PCP - General (Family Medicine) Doreatha Massed, MD as Consulting Physician (Hematology)   ASSESSMENT & PLAN:   Assessment: 1.  High-grade DCIS of the left breast: -Status post lumpectomy on 01/27/2015 with pathology showing DCIS, high-grade with calcifications. - XRT in March 2017. -Tamoxifen from March 2017 through March 2022.   2.  Osteopenia: -DEXA scan on 04/24/2019 reviewed by me shows T score of -1.2.  Plan: 1.  High-grade DCIS of the left breast: - Reviewed labs from 06/28/2023: Normal LFTs and CBC. - Mammogram from 05/19/2023: BI-RADS Category 1. - She would like to continue follow-up once a year in our clinic with repeat mammogram and labs.  RTC 1 year.  We will follow her until 10 years after diagnosis.   2.  Hot flashes: - She is taking gabapentin 100 mg in the mornings and 300 mg at bedtime.  She is also on Celexa 40 mg daily.  Insurance would not approve Veozah.   3.  Osteopenia: - Continue vitamin D 1000 units daily.  Vitamin D level is normal at 35.  Orders Placed This Encounter  Procedures   MM 3D SCREENING MAMMOGRAM BILATERAL BREAST    Standing Status:   Future    Expected Date:   05/14/2024    Expiration Date:   07/04/2024    Reason for Exam (SYMPTOM  OR DIAGNOSIS REQUIRED):   breast cancer screening    Is the patient pregnant?:   No    Preferred imaging location?:   [facility] R Teague,acting as a scribe for Doreatha Massed, MD.,have documented all relevant documentation on the behalf of Doreatha Massed, MD,as directed by  Doreatha Massed, MD while in the presence of Doreatha Massed, MD.  I, Doreatha Massed MD, have reviewed the above documentation for accuracy and completeness, and I agree with the above.    Doreatha Massed, MD   4/15/202512:26 PM  CHIEF COMPLAINT:   Diagnosis: DCIS of left breast   Cancer Staging  No matching staging information was found for the patient.    Prior Therapy: tamoxifen 05/2015 through 05/2020  Current Therapy:  surveillance   HISTORY OF PRESENT ILLNESS:   Oncology History   No history exists.     INTERVAL HISTORY:   Kristen Kaiser is a 58 y.o. female presenting to clinic today for follow up of DCIS of left breast. She was last seen by me on 06/29/22.  Since her last visit, she underwent bilateral screening mammogram on 05/19/23 that found: No mammographic evidence of malignancy.   Today, she states that she is doing well overall. Her appetite level is at 100%. Her energy level is at 75%. Krislyn reports occasional mild dizziness and notes a history of vertigo. She is taking Vitamin D as prescribed. She did not take Veozah for hot flashes due to the cost of the medication.    PAST MEDICAL HISTORY:   Past Medical History: Past Medical History:  Diagnosis Date   Angio-edema    Breast cancer (HCC) 2016   COVID-19    Eczema    Hypertension    Personal history of radiation therapy 2016   Urticaria     Surgical History: Past Surgical History:  Procedure Laterality Date   BREAST LUMPECTOMY Left 2016   CESAREAN SECTION  Social History: Social History   Socioeconomic History   Marital status: Married    Spouse name: Not on file   Number of children: Not on file   Years of education: Not on file   Highest education level: Not on file  Occupational History   Not on file  Tobacco Use   Smoking status: Former    Current packs/day: 0.00    Average packs/day: 1 pack/day for 15.0 years (15.0 ttl pk-yrs)    Types: Cigarettes    Start date: 12/18/1983    Quit date: 12/18/1998    Years since quitting: 24.5   Smokeless tobacco: Never  Vaping Use   Vaping status: Never Used  Substance and Sexual Activity   Alcohol use: Yes    Comment: occ   Drug use:  Never   Sexual activity: Yes    Birth control/protection: Post-menopausal  Other Topics Concern   Not on file  Social History Narrative   Not on file   Social Drivers of Health   Financial Resource Strain: Low Risk  (07/16/2019)   Overall Financial Resource Strain (CARDIA)    Difficulty of Paying Living Expenses: Not hard at all  Food Insecurity: No Food Insecurity (07/16/2019)   Hunger Vital Sign    Worried About Running Out of Food in the Last Year: Never true    Ran Out of Food in the Last Year: Never true  Transportation Needs: No Transportation Needs (07/16/2019)   PRAPARE - Administrator, Civil Service (Medical): No    Lack of Transportation (Non-Medical): No  Physical Activity: Inactive (07/16/2019)   Exercise Vital Sign    Days of Exercise per Week: 0 days    Minutes of Exercise per Session: 0 min  Stress: No Stress Concern Present (07/16/2019)   [name]    Feeling of Stress : Only a little  Social Connections: Moderately Integrated (07/16/2019)   Social Connection and Isolation Panel [NHANES]    Frequency of Communication with Friends and Family: More than three times a week    Frequency of Social Gatherings with Friends and Family: Once a week    Attends Religious Services: More than 4 times per year    Active Member of [employer] or Organizations: No    Attends Banker Meetings: Never    Marital Status: Married  Catering manager Violence: Not At Risk (07/16/2019)   Humiliation, Afraid, Rape, and Kick questionnaire    Fear of Current or Ex-Partner: No    Emotionally Abused: No    Physically Abused: No    Sexually Abused: No    Family History: Family History  Problem Relation Age of Onset   Cancer Paternal Grandfather    Asthma Paternal Grandfather    Alzheimer's disease Paternal Grandmother    Congestive Heart Failure Maternal Grandmother    Cancer Maternal Grandfather         rectal   Atrial fibrillation Father    Atrial fibrillation Mother    Hypertension Brother    Hypertension Sister    Allergic rhinitis Neg Hx    Eczema Neg Hx    Urticaria Neg Hx     Current Medications:  Current Outpatient Medications:    cetirizine (ZYRTEC) 10 MG tablet, TAKE 1 TABLET BY MOUTH TWICE A DAY, Disp: 180 tablet, Rfl: 1   citalopram (CELEXA) 40 MG tablet, TAKE 1 TABLET BY MOUTH EVERY DAY, Disp: 90 tablet, Rfl: 4   EPINEPHrine 0.3  mg/0.3 mL IJ SOAJ injection, SMARTSIG:0.3 Milliliter(s) IM Once PRN, Disp: , Rfl:    famotidine (PEPCID) 20 MG tablet, Take 1 tablet (20 mg total) by mouth 2 (two) times daily., Disp: 60 tablet, Rfl: 5   gabapentin (NEURONTIN) 600 MG tablet, TAKE 1/2 TABLET BY MOUTH AT BEDTIME, Disp: 45 tablet, Rfl: 5   olmesartan-hydrochlorothiazide (BENICAR HCT) 40-12.5 MG tablet, Take 1 tablet by mouth daily., Disp: , Rfl:    Allergies: Allergies  Allergen Reactions   Alpha-Gal    Lisinopril Swelling    REVIEW OF SYSTEMS:   Review of Systems  Constitutional:  Negative for chills, fatigue and fever.  HENT:   Negative for lump/mass, mouth sores, nosebleeds, sore throat and trouble swallowing.   Eyes:  Negative for eye problems.  Respiratory:  Negative for cough and shortness of breath.   Cardiovascular:  Negative for chest pain, leg swelling and palpitations.  Gastrointestinal:  Negative for abdominal pain, constipation, diarrhea, nausea and vomiting.  Genitourinary:  Negative for bladder incontinence, difficulty urinating, dysuria, frequency, hematuria and nocturia.   Musculoskeletal:  Negative for arthralgias, back pain, flank pain, myalgias and neck pain.  Skin:  Negative for itching and rash.  Neurological:  Positive for dizziness. Negative for headaches and numbness.  Hematological:  Does not bruise/bleed easily.  Psychiatric/Behavioral:  Negative for depression, sleep disturbance and suicidal ideas. The patient is not nervous/anxious.   All other  systems reviewed and are negative.    VITALS:   Blood pressure 103/78, pulse 65, temperature 97.9 F (36.6 C), temperature source Tympanic, resp. rate 18, height 5\' 2"  (1.575 m), weight 171 lb (77.6 kg), SpO2 100%.  Wt Readings from Last 3 Encounters:  07/05/23 171 lb (77.6 kg)  06/29/22 179 lb 14.4 oz (81.6 kg)  07/10/21 179 lb (81.2 kg)    Body mass index is 31.28 kg/m.  Performance status (ECOG): 0 - Asymptomatic  PHYSICAL EXAM:   Physical Exam Vitals and nursing note reviewed. Exam conducted with a chaperone present.  Constitutional:      Appearance: Normal appearance.  Cardiovascular:     Rate and Rhythm: Normal rate and regular rhythm.     Pulses: Normal pulses.     Heart sounds: Normal heart sounds.  Pulmonary:     Effort: Pulmonary effort is normal.     Breath sounds: Normal breath sounds.  Abdominal:     Palpations: Abdomen is soft. There is no hepatomegaly, splenomegaly or mass.     Tenderness: There is no abdominal tenderness.  Musculoskeletal:     Right lower leg: No edema.     Left lower leg: No edema.  Lymphadenopathy:     Cervical: No cervical adenopathy.     Right cervical: No superficial, deep or posterior cervical adenopathy.    Left cervical: No superficial, deep or posterior cervical adenopathy.     Upper Body:     Right upper body: No supraclavicular or axillary adenopathy.     Left upper body: No supraclavicular or axillary adenopathy.  Neurological:     General: No focal deficit present.     Mental Status: She is alert and oriented to person, place, and time.  Psychiatric:        Mood and Affect: Mood normal.        Behavior: Behavior normal.     LABS:      Latest Ref Rng & Units 06/28/2023    9:04 AM 06/21/2022    3:22 PM 06/23/2021   11:12 AM  CBC  WBC 4.0 - 10.5 K/uL 4.8  5.3  5.0   Hemoglobin 12.0 - 15.0 g/dL 16.1  09.6  04.5   Hematocrit 36.0 - 46.0 % 43.7  41.9  46.0   Platelets 150 - 400 K/uL 258  235  226       Latest Ref Rng  & Units 06/28/2023    9:04 AM 06/21/2022    3:22 PM 06/23/2021   11:12 AM  CMP  Glucose 70 - 99 mg/dL 89  409  97   BUN 6 - 20 mg/dL 14  13  10    Creatinine 0.44 - 1.00 mg/dL 8.11  9.14  7.82   Sodium 135 - 145 mmol/L 136  135  138   Potassium 3.5 - 5.1 mmol/L 4.3  3.2  3.7   Chloride 98 - 111 mmol/L 101  104  104   CO2 22 - 32 mmol/L 25  23  24    Calcium 8.9 - 10.3 mg/dL 9.4  9.1  9.0   Total Protein 6.5 - 8.1 g/dL 7.5  7.0  7.6   Total Bilirubin 0.0 - 1.2 mg/dL 0.8  0.7  0.8   Alkaline Phos 38 - 126 U/L 111  110  115   AST 15 - 41 U/L 23  20  26    ALT 0 - 44 U/L 20  18  25       No results found for: "CEA1", "CEA" / No results found for: "CEA1", "CEA" No results found for: "PSA1" No results found for: "NFA213" No results found for: "CAN125"  No results found for: "TOTALPROTELP", "ALBUMINELP", "A1GS", "A2GS", "BETS", "BETA2SER", "GAMS", "MSPIKE", "SPEI" No results found for: "TIBC", "FERRITIN", "IRONPCTSAT" Lab Results  Component Value Date   LDH 139 06/28/2023   LDH 148 06/21/2022   LDH 175 06/23/2021     STUDIES:   No results found.

## 2023-07-05 NOTE — Patient Instructions (Addendum)
 [facility] at [facility] Discharge Instructions   You were seen and examined today by Dr. Cheree Cords.  He reviewed the results of your lab work which are normal/stable.   We will see you back in one year. We will repeat blood work and a mammogram prior to this visit.    Return as scheduled.    Thank you for choosing [facility] at [facility] to provide your oncology and hematology care.  To afford each patient quality time with our provider, please arrive at least 15 minutes before your scheduled appointment time.   If you have a lab appointment with the Cancer Center please come in thru the Main Entrance and check in at the main information desk.  You need to re-schedule your appointment should you arrive 10 or more minutes late.  We strive to give you quality time with our providers, and arriving late affects you and other patients whose appointments are after yours.  Also, if you no show three or more times for appointments you may be dismissed from the clinic at the providers discretion.     Again, thank you for choosing [facility].  Our hope is that these requests will decrease the amount of time that you wait before being seen by our physicians.       _____________________________________________________________  Should you have questions after your visit to [facility], please contact our office at [phone] and follow the prompts.  Our office hours are 8:00 a.m. and 4:30 p.m. Monday - Friday.  Please note that voicemails left after 4:00 p.m. may not be returned until the following business day.  We are closed weekends and major holidays.  You do have access to a nurse 24-7, just call the main number to the clinic [phone] and do not press any options, hold on the line and a nurse will answer the phone.    For prescription refill requests, have your pharmacy contact our office and allow 72 hours.    Due  to Covid, you will need to wear a mask upon entering the hospital. If you do not have a mask, a mask will be given to you at the Main Entrance upon arrival. For doctor visits, patients may have 1 support person age 30 or older with them. For treatment visits, patients can not have anyone with them due to social distancing guidelines and our immunocompromised population.

## 2023-07-12 ENCOUNTER — Encounter (HOSPITAL_COMMUNITY): Payer: Self-pay | Admitting: Hematology

## 2023-08-08 ENCOUNTER — Other Ambulatory Visit: Payer: Self-pay | Admitting: Hematology

## 2023-08-08 DIAGNOSIS — D0512 Intraductal carcinoma in situ of left breast: Secondary | ICD-10-CM

## 2024-02-07 ENCOUNTER — Encounter: Admitting: Adult Health

## 2024-04-02 ENCOUNTER — Other Ambulatory Visit: Payer: Self-pay | Admitting: *Deleted

## 2024-04-02 MED ORDER — GABAPENTIN 600 MG PO TABS: 300.0000 mg | ORAL_TABLET | Freq: Every day | ORAL | 5 refills | Status: AC

## 2024-04-03 ENCOUNTER — Other Ambulatory Visit (HOSPITAL_COMMUNITY): Payer: Self-pay | Admitting: Oncology

## 2024-04-03 ENCOUNTER — Other Ambulatory Visit: Payer: Self-pay | Admitting: *Deleted

## 2024-04-03 DIAGNOSIS — Z1231 Encounter for screening mammogram for malignant neoplasm of breast: Secondary | ICD-10-CM

## 2024-04-03 DIAGNOSIS — D0512 Intraductal carcinoma in situ of left breast: Secondary | ICD-10-CM

## 2024-04-19 ENCOUNTER — Encounter: Admitting: Adult Health

## 2024-05-23 ENCOUNTER — Ambulatory Visit (HOSPITAL_COMMUNITY)

## 2024-07-05 ENCOUNTER — Other Ambulatory Visit

## 2024-07-12 ENCOUNTER — Inpatient Hospital Stay: Payer: Self-pay | Admitting: Oncology

## 2024-07-12 ENCOUNTER — Ambulatory Visit: Payer: Self-pay | Admitting: Oncology
